# Patient Record
Sex: Female | Born: 1950 | Race: White | Hispanic: Yes | Marital: Married | Smoking: Never smoker
Health system: Southern US, Community
[De-identification: ages and names within clinical notes are randomized; demographics above are authoritative.]

## PROBLEM LIST (undated history)

## (undated) ENCOUNTER — Emergency Department (HOSPITAL_COMMUNITY): Admission: EM | Payer: Self-pay | Source: Home / Self Care

## (undated) DIAGNOSIS — I480 Paroxysmal atrial fibrillation: Secondary | ICD-10-CM

## (undated) DIAGNOSIS — N183 Chronic kidney disease, stage 3 unspecified: Secondary | ICD-10-CM

## (undated) DIAGNOSIS — I10 Essential (primary) hypertension: Secondary | ICD-10-CM

## (undated) DIAGNOSIS — E785 Hyperlipidemia, unspecified: Secondary | ICD-10-CM

## (undated) DIAGNOSIS — E119 Type 2 diabetes mellitus without complications: Secondary | ICD-10-CM

## (undated) DIAGNOSIS — F419 Anxiety disorder, unspecified: Secondary | ICD-10-CM

## (undated) DIAGNOSIS — E559 Vitamin D deficiency, unspecified: Secondary | ICD-10-CM

## (undated) HISTORY — DX: Paroxysmal atrial fibrillation: I48.0

## (undated) HISTORY — DX: Chronic kidney disease, stage 3 unspecified: N18.30

## (undated) HISTORY — DX: Hyperlipidemia, unspecified: E78.5

## (undated) HISTORY — DX: Vitamin D deficiency, unspecified: E55.9

## (undated) HISTORY — DX: Chronic kidney disease, stage 3 (moderate): N18.3

## (undated) HISTORY — DX: Anxiety disorder, unspecified: F41.9

## (undated) HISTORY — DX: Essential (primary) hypertension: I10

## (undated) HISTORY — DX: Type 2 diabetes mellitus without complications: E11.9

---

## 2009-05-26 ENCOUNTER — Ambulatory Visit: Payer: Self-pay | Admitting: Cardiology

## 2013-06-05 ENCOUNTER — Encounter: Payer: Self-pay | Admitting: Family Medicine

## 2013-06-05 ENCOUNTER — Ambulatory Visit (INDEPENDENT_AMBULATORY_CARE_PROVIDER_SITE_OTHER): Payer: Self-pay | Admitting: Family Medicine

## 2013-06-05 ENCOUNTER — Telehealth: Payer: Self-pay | Admitting: Nurse Practitioner

## 2013-06-05 ENCOUNTER — Ambulatory Visit (INDEPENDENT_AMBULATORY_CARE_PROVIDER_SITE_OTHER): Payer: BC Managed Care – PPO

## 2013-06-05 VITALS — BP 151/69 | HR 75 | Temp 97.0°F | Wt 252.8 lb

## 2013-06-05 DIAGNOSIS — IMO0001 Reserved for inherently not codable concepts without codable children: Secondary | ICD-10-CM | POA: Insufficient documentation

## 2013-06-05 DIAGNOSIS — M25561 Pain in right knee: Secondary | ICD-10-CM

## 2013-06-05 DIAGNOSIS — M25569 Pain in unspecified knee: Secondary | ICD-10-CM

## 2013-06-05 DIAGNOSIS — E669 Obesity, unspecified: Secondary | ICD-10-CM | POA: Insufficient documentation

## 2013-06-05 DIAGNOSIS — R030 Elevated blood-pressure reading, without diagnosis of hypertension: Secondary | ICD-10-CM

## 2013-06-05 MED ORDER — DICLOFENAC SODIUM 1 % TD GEL: 2.0000 g | Freq: Four times a day (QID) | TRANSDERMAL | Status: DC

## 2013-06-05 NOTE — Progress Notes (Signed)
Patient ID: Denise Rowe, female   DOB: 05/20/1951, 62 y.o.   MRN: [id number] SUBJECTIVE: CC: Chief Complaint  Patient presents with  . Acute Visit    pain rt leg x 1 month would like K+checked     HPI: 1 month ago had a leg cramp at night. 1 week later started to have pain at the beck of the right knee. Pain goes down the calf and up the back of the thigh. 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 No neurologic deficits. Limping and cannot fully bend the knee and stepping up the  Steps. No direct history.  No past medical history on file. No past surgical history on file. History   Social History  . Marital Status: Married    Spouse Name: N/A    Number of Children: N/A  . Years of Education: N/A   Occupational History  . Not on file.   Social History Main Topics  . Smoking status: Former Smoker    Quit date: 06/05/1978  . Smokeless tobacco: Not on file  . Alcohol Use: Not on file  . Drug Use: Not on file  . Sexually Active: Not on file   Other Topics Concern  . Not on file   Social History Narrative  . No narrative on file   No family history on file. No current outpatient prescriptions on file prior to visit.   No current facility-administered medications on file prior to visit.   No Known Allergies  There is no immunization history on file for this patient. Prior to Admission medications   Medication Sig Start Date End Date Taking? Authorizing Provider  diclofenac sodium (VOLTAREN) 1 % GEL Apply 2 g topically 4 (four) times daily. 06/05/13   Vernie Shanks, MD     ROS: As above in the HPI. All other systems are stable or negative.  OBJECTIVE: APPEARANCE:  Patient in no acute distress.The patient appeared well nourished and normally developed. Acyanotic. Waist: VITAL SIGNS:BP 151/69  Pulse 75  Temp(Src) 97 F (36.1 C) (Oral)  Wt 252 lb 12.8 oz (114.669 kg) Hispanic F Obese  SKIN: warm and  Dry without overt rashes,  tattoos and scars  HEAD and Neck: without JVD, Head and scalp: normal Eyes:No scleral icterus. Fundi normal, eye movements normal. Ears: Auricle normal, canal normal, Tympanic membranes normal, insufflation normal. Nose: normal Throat: normal Neck & thyroid: normal  CHEST & LUNGS: Chest wall: normal Lungs: Clear  CVS: Reveals the PMI to be normally located. Regular rhythm, First and Second Heart sounds are normal,  absence of murmurs, rubs or gallops. Peripheral vasculature: Radial pulses: normal Dorsal pedis pulses: normal Posterior pulses: normal  ABDOMEN:  Appearance: obese Benign, no organomegaly, no masses, no Abdominal Aortic enlargement. No Guarding , no rebound. No Bruits. Bowel sounds: normal  RECTAL: N/A GU: N/A  EXTREMETIES: nonedematous. Both Femoral and Pedal pulses are normal.  MUSCULOSKELETAL:  Spine: normal Joints: right knee: reduced ROM secondary to pain.right posterior knee yender. Flexion reduced to 75 degrees. Medial and lateral compartment pain. Grind test ++ ACL and PCL intact.  NEUROLOGIC: oriented to time,place and person; nonfocal. Strength is normal Sensory is normal Reflexes are normal Cranial Nerves are normal.  ASSESSMENT: Knee pain, acute, right - Plan: MR Knee Left  Wo Contrast, DG Knee 1-2 Views Right  Obesity, unspecified  Elevated BP  PLAN: [facility] reading (PRIMARY) by  Dr. Jacelyn Grip : No acute findings.  Orders Placed This Encounter  Procedures  . MR Knee Left  Wo Contrast    Standing Status: Future     Number of Occurrences:      Standing Expiration Date: 08/06/2014    Order Specific Question:  Reason for Exam (SYMPTOM  OR DIAGNOSIS REQUIRED)    Answer:  right knee pain, inability to bear weight Grind test positive for meniscus tear    Order Specific Question:  Is the patient pregnant?    Answer:  No    Order Specific Question:  Preferred imaging location?    Answer:  [facility]     Order Specific Question:  Does the patient have a pacemaker, internal devices, implants, aneury    Answer:  No  . DG Knee 1-2 Views Right    Standing Status: Future     Number of Occurrences: 1     Standing Expiration Date: 08/05/2014    Order Specific Question:  Reason for Exam (SYMPTOM  OR DIAGNOSIS REQUIRED)    Answer:  pain    Order Specific Question:  Is the patient pregnant?    Answer:  No    Order Specific Question:  Preferred imaging location?    Answer:  [facility]   Meds ordered this encounter  Medications  . diclofenac sodium (VOLTAREN) 1 % GEL    Sig: Apply 2 g topically 4 (four) times daily.    Dispense:  100 g    Refill:  1   Return if symptoms worsen or fail to improve, for await the MRI result.  Sherwood Castilla P. Jacelyn Grip, M.D.

## 2013-06-05 NOTE — Telephone Encounter (Signed)
appt given  

## 2013-06-08 ENCOUNTER — Other Ambulatory Visit (HOSPITAL_COMMUNITY): Payer: Self-pay

## 2013-06-08 ENCOUNTER — Other Ambulatory Visit: Payer: Self-pay | Admitting: Family Medicine

## 2013-06-08 ENCOUNTER — Ambulatory Visit (HOSPITAL_COMMUNITY)
Admission: RE | Admit: 2013-06-08 | Discharge: 2013-06-08 | Disposition: A | Discharge status: Home / Self Care | Payer: BC Managed Care – PPO | Source: Ambulatory Visit | Attending: Family Medicine | Admitting: Family Medicine

## 2013-06-08 DIAGNOSIS — M23329 Other meniscus derangements, posterior horn of medial meniscus, unspecified knee: Secondary | ICD-10-CM | POA: Insufficient documentation

## 2013-06-08 DIAGNOSIS — M25569 Pain in unspecified knee: Secondary | ICD-10-CM | POA: Insufficient documentation

## 2013-06-08 DIAGNOSIS — M25561 Pain in right knee: Secondary | ICD-10-CM

## 2013-06-08 DIAGNOSIS — M7120 Synovial cyst of popliteal space [Baker], unspecified knee: Secondary | ICD-10-CM | POA: Insufficient documentation

## 2013-06-08 DIAGNOSIS — M25469 Effusion, unspecified knee: Secondary | ICD-10-CM | POA: Insufficient documentation

## 2013-06-08 NOTE — Addendum Note (Signed)
Addended by: Vernie Shanks on: 06/08/2013 02:56 PM   Modules accepted: Orders

## 2013-06-08 NOTE — Progress Notes (Signed)
Patient ID: Denise Rowe, female   DOB: 01/02/1951, 62 y.o.   MRN: [id number] Need to change MRI of the Right knee.  Denise Rowe P. Jacelyn Grip, M.D.

## 2013-06-09 ENCOUNTER — Other Ambulatory Visit: Payer: Self-pay | Admitting: Family Medicine

## 2013-06-09 ENCOUNTER — Telehealth: Payer: Self-pay | Admitting: Family Medicine

## 2013-06-09 DIAGNOSIS — S83206S Unspecified tear of unspecified meniscus, current injury, right knee, sequela: Secondary | ICD-10-CM

## 2013-06-09 DIAGNOSIS — M25561 Pain in right knee: Secondary | ICD-10-CM

## 2013-06-09 NOTE — Progress Notes (Signed)
Quick Note:  Labs MRI. Partial tear of her meniscus cartilage. Needs to see orthopedics. As I suspected. ______

## 2013-06-09 NOTE — Telephone Encounter (Signed)
Left message on pt vm results and to call Tuesday to see where debbie is with the referral

## 2014-03-12 DIAGNOSIS — E785 Hyperlipidemia, unspecified: Secondary | ICD-10-CM | POA: Insufficient documentation

## 2014-03-12 DIAGNOSIS — Z0000 Encounter for general adult medical examination without abnormal findings: Secondary | ICD-10-CM | POA: Insufficient documentation

## 2014-03-12 DIAGNOSIS — E669 Obesity, unspecified: Secondary | ICD-10-CM | POA: Insufficient documentation

## 2014-06-11 DIAGNOSIS — N390 Urinary tract infection, site not specified: Secondary | ICD-10-CM | POA: Insufficient documentation

## 2015-01-31 DIAGNOSIS — R7989 Other specified abnormal findings of blood chemistry: Secondary | ICD-10-CM | POA: Insufficient documentation

## 2015-01-31 DIAGNOSIS — E559 Vitamin D deficiency, unspecified: Secondary | ICD-10-CM | POA: Insufficient documentation

## 2015-01-31 DIAGNOSIS — I10 Essential (primary) hypertension: Secondary | ICD-10-CM | POA: Insufficient documentation

## 2015-02-21 ENCOUNTER — Other Ambulatory Visit: Payer: Self-pay | Admitting: Nephrology

## 2015-02-21 DIAGNOSIS — I159 Secondary hypertension, unspecified: Secondary | ICD-10-CM

## 2015-02-21 DIAGNOSIS — E118 Type 2 diabetes mellitus with unspecified complications: Secondary | ICD-10-CM

## 2015-02-21 DIAGNOSIS — N179 Acute kidney failure, unspecified: Secondary | ICD-10-CM

## 2015-02-28 ENCOUNTER — Ambulatory Visit
Admission: RE | Admit: 2015-02-28 | Discharge: 2015-02-28 | Disposition: A | Discharge status: Home / Self Care | Payer: Self-pay | Source: Ambulatory Visit | Attending: Nephrology | Admitting: Nephrology

## 2015-02-28 DIAGNOSIS — N179 Acute kidney failure, unspecified: Secondary | ICD-10-CM

## 2015-02-28 DIAGNOSIS — I159 Secondary hypertension, unspecified: Secondary | ICD-10-CM

## 2015-02-28 DIAGNOSIS — E118 Type 2 diabetes mellitus with unspecified complications: Secondary | ICD-10-CM

## 2015-11-06 DIAGNOSIS — I480 Paroxysmal atrial fibrillation: Secondary | ICD-10-CM | POA: Insufficient documentation

## 2015-12-17 DIAGNOSIS — G4730 Sleep apnea, unspecified: Secondary | ICD-10-CM | POA: Insufficient documentation

## 2016-07-28 DIAGNOSIS — E785 Hyperlipidemia, unspecified: Secondary | ICD-10-CM | POA: Insufficient documentation

## 2016-07-28 DIAGNOSIS — I482 Chronic atrial fibrillation, unspecified: Secondary | ICD-10-CM | POA: Insufficient documentation

## 2016-07-31 ENCOUNTER — Ambulatory Visit: Payer: BLUE CROSS/BLUE SHIELD | Admitting: Internal Medicine

## 2016-08-28 ENCOUNTER — Encounter: Payer: Self-pay | Admitting: Cardiology

## 2016-08-28 NOTE — Progress Notes (Signed)
Cardiology Office Note  Date: 08/31/2016   ID: Denitra Donaghey [place], [place] 19-Aug-1951, MRN [id number]  PCP: Sherrie Mustache, MD  Consulting Cardiologist: Rozann Lesches, MD   Chief Complaint  Patient presents with  . Atrial Fibrillation    History of Present Illness: Denise Rowe is a 65 y.o. female referred for cardiology consultation by Dr. Edrick Oh. I reviewed her records and updated the chart, she is a former patient of Dr. Hamilton Capri with the [facility] cardiology practice, last seen in April of this year. She has a history of paroxysmal atrial fibrillation, had a failed cardioversion attempt earlier this year at [facility], although ultimately spontaneously converted to sinus rhythm. She states that she has had no significant palpitations since that time.  She has undergone previous cardiac testing including echocardiogram back in December 2016, I was not able to access the report. We are requesting the results from [facility].  CHADSVASC score is 3. She is on renally dosed Xarelto. Reports no bleeding problems. She also continues on metoprolol.  She works for [employer]. Originally from [place].  I reviewed her ECG today which shows sinus bradycardia with rightward axis.  Past Medical History:  Diagnosis Date  . Anxiety   . CKD (chronic kidney disease) stage 3, GFR 30-59 ml/min   . Essential hypertension   . Hyperlipidemia   . PAF (paroxysmal atrial fibrillation) ([place])   . Type 2 diabetes mellitus ([place])   . Vitamin D deficiency     Past Surgical History:  Procedure Laterality Date  . CESAREAN SECTION      Current Outpatient Prescriptions  Medication Sig Dispense Refill  . cholecalciferol (VITAMIN D) 1000 units tablet Take 1,000 Units by mouth daily.    . metoprolol tartrate (LOPRESSOR) 25 MG tablet Take 25 mg by mouth 2 (two) times daily.    . Rivaroxaban (XARELTO) 15 MG TABS tablet Take 15 mg by mouth daily.     No current facility-administered  medications for this visit.    Allergies:  Review of patient's allergies indicates no known allergies.   Social History: The patient  reports that she has never smoked. She has never used smokeless tobacco. She reports that she does not drink alcohol or use drugs.   Family History: The patient's family history includes Cancer in her mother; Diabetes Mellitus II in her father and mother; Heart disease in her father.   ROS:  Please see the history of present illness. Otherwise, complete review of systems is positive for intermittent feelings of anxiety, also elevations in blood pressure when she checks at home periodically.  All other systems are reviewed and negative.   Physical Exam: VS:  BP 140/78   Pulse 81   Ht 5\' 7"  (1.702 m)   Wt 231 lb (104.8 kg)   SpO2 99%   BMI 36.18 kg/m , BMI Body mass index is 36.18 kg/m.  Wt Readings from Last 3 Encounters:  08/31/16 231 lb (104.8 kg)  06/05/13 252 lb 12.8 oz (114.7 kg)    General: Overweight woman, appears comfortable at rest. HEENT: Conjunctiva and lids normal, oropharynx clear. Neck: Supple, no elevated JVP or carotid bruits, no thyromegaly. Lungs: Clear to auscultation, nonlabored breathing at rest. Cardiac: Regular rate and rhythm, no S3 or significant systolic murmur, no pericardial rub. Abdomen: Soft, nontender, bowel sounds present. Extremities: No pitting edema, distal pulses 2+. Skin: Warm and dry. Musculoskeletal: No kyphosis. Neuropsychiatric: Alert and oriented x3, affect grossly appropriate.  ECG: There is no old tracing for  comparison.  Recent Labwork:  March 2016: Hemoglobin 14.3, platelets 265, BUN 64, creatinine 3.1, potassium 4.6, cholesterol 257, triglycerides 280, HDL 48, LDL 153 September 2017: BUN 19, creatinine 1.6, potassium 4.6, AST 23, ALT 24, hemoglobin 13.8, platelet 197, hemoglobin A1c 5.7, cholesterol 217, triglycerides 180, HDL 51, LDL 130, TSH 3.56  Assessment and Plan:  1. Paroxysmal atrial  fibrillation, CHADSVASC score of 3. She is currently maintaining sinus rhythm and is symptomatically stable on Lopressor and renally dosed Xarelto. No changes were made today. We are requesting her echocardiogram report from [facility].  2. Essential hypertension. Patient taken off ACE inhibitor previously due to acute on chronic renal failure. She follows with Dr. Joelyn Oms in [facility] for nephrology assessment, most recent creatinine was 1.6. If blood pressure trend remains up, could consider another agent such as Norvasc.  3. Type 2 diabetes mellitus, followed by Dr. Edrick Oh. Hemoglobin A1c 5.7 in September.  4. Hyperlipidemia by history, LDL 130 in September.  Current medicines were reviewed with the patient today.   Orders Placed This Encounter  Procedures  . EKG 12-Lead    Disposition: Follow-up with me in 6 months.  Signed, Satira Sark, MD, [facility] 08/31/2016 8:46 AM    [facility] [place]. [address] [place], [place] [address] Phone: [phone]; Fax: [phone]

## 2016-08-31 ENCOUNTER — Encounter: Payer: Self-pay | Admitting: Cardiology

## 2016-08-31 ENCOUNTER — Ambulatory Visit (INDEPENDENT_AMBULATORY_CARE_PROVIDER_SITE_OTHER): Payer: BLUE CROSS/BLUE SHIELD | Admitting: Cardiology

## 2016-08-31 VITALS — BP 140/78 | HR 81 | Ht 67.0 in | Wt 231.0 lb

## 2016-08-31 DIAGNOSIS — I10 Essential (primary) hypertension: Secondary | ICD-10-CM | POA: Diagnosis not present

## 2016-08-31 DIAGNOSIS — I480 Paroxysmal atrial fibrillation: Secondary | ICD-10-CM | POA: Diagnosis not present

## 2016-08-31 DIAGNOSIS — E119 Type 2 diabetes mellitus without complications: Secondary | ICD-10-CM | POA: Diagnosis not present

## 2016-08-31 DIAGNOSIS — E782 Mixed hyperlipidemia: Secondary | ICD-10-CM | POA: Diagnosis not present

## 2016-08-31 NOTE — Patient Instructions (Signed)
Your physician wants you to follow-up in: 6 months with Dr.McDowell You will receive a reminder letter in the mail two months in advance. If you don't receive a letter, please call our office to schedule the follow-up appointment.     Your physician recommends that you continue on your current medications as directed. Please refer to the Current Medication list given to you today.     Thank you for choosing [facility] !        

## 2016-11-19 DIAGNOSIS — F411 Generalized anxiety disorder: Secondary | ICD-10-CM | POA: Insufficient documentation

## 2016-11-19 DIAGNOSIS — I10 Essential (primary) hypertension: Secondary | ICD-10-CM | POA: Diagnosis not present

## 2016-11-19 DIAGNOSIS — J0110 Acute frontal sinusitis, unspecified: Secondary | ICD-10-CM | POA: Diagnosis not present

## 2016-11-19 DIAGNOSIS — E559 Vitamin D deficiency, unspecified: Secondary | ICD-10-CM | POA: Diagnosis not present

## 2016-11-19 DIAGNOSIS — E669 Obesity, unspecified: Secondary | ICD-10-CM | POA: Diagnosis not present

## 2016-12-21 DIAGNOSIS — M79671 Pain in right foot: Secondary | ICD-10-CM | POA: Diagnosis not present

## 2017-01-12 ENCOUNTER — Other Ambulatory Visit: Payer: Self-pay | Admitting: Cardiology

## 2017-01-12 ENCOUNTER — Telehealth: Payer: Self-pay | Admitting: Cardiology

## 2017-01-12 DIAGNOSIS — R7309 Other abnormal glucose: Secondary | ICD-10-CM | POA: Diagnosis not present

## 2017-01-12 DIAGNOSIS — E559 Vitamin D deficiency, unspecified: Secondary | ICD-10-CM | POA: Diagnosis not present

## 2017-01-12 DIAGNOSIS — E784 Other hyperlipidemia: Secondary | ICD-10-CM | POA: Diagnosis not present

## 2017-01-12 MED ORDER — METOPROLOL TARTRATE 25 MG PO TABS: 25.0000 mg | ORAL_TABLET | Freq: Two times a day (BID) | ORAL | 1 refills | Status: DC

## 2017-01-12 NOTE — Telephone Encounter (Signed)
Pt would like to know why she can no longer get her metoprolol tartrate (LOPRESSOR) 25 MG tablet [[id number]]  Filled. [phone]

## 2017-01-12 NOTE — Telephone Encounter (Signed)
Attempted to reach pt at number given,got vm,[id number]

## 2017-01-12 NOTE — Telephone Encounter (Signed)
Refilled lopressor 25 bid

## 2017-01-14 DIAGNOSIS — E790 Hyperuricemia without signs of inflammatory arthritis and tophaceous disease: Secondary | ICD-10-CM | POA: Diagnosis not present

## 2017-01-14 DIAGNOSIS — R7309 Other abnormal glucose: Secondary | ICD-10-CM | POA: Diagnosis not present

## 2017-01-14 DIAGNOSIS — I10 Essential (primary) hypertension: Secondary | ICD-10-CM | POA: Diagnosis not present

## 2017-01-15 DIAGNOSIS — N261 Atrophy of kidney (terminal): Secondary | ICD-10-CM | POA: Diagnosis not present

## 2017-01-15 DIAGNOSIS — I480 Paroxysmal atrial fibrillation: Secondary | ICD-10-CM | POA: Diagnosis not present

## 2017-01-15 DIAGNOSIS — I10 Essential (primary) hypertension: Secondary | ICD-10-CM | POA: Diagnosis not present

## 2017-01-15 DIAGNOSIS — N184 Chronic kidney disease, stage 4 (severe): Secondary | ICD-10-CM | POA: Diagnosis not present

## 2017-01-18 DIAGNOSIS — N184 Chronic kidney disease, stage 4 (severe): Secondary | ICD-10-CM | POA: Diagnosis not present

## 2017-01-18 DIAGNOSIS — R8290 Unspecified abnormal findings in urine: Secondary | ICD-10-CM | POA: Diagnosis not present

## 2017-01-20 ENCOUNTER — Other Ambulatory Visit: Payer: Self-pay | Admitting: Cardiology

## 2017-01-27 DIAGNOSIS — N184 Chronic kidney disease, stage 4 (severe): Secondary | ICD-10-CM | POA: Diagnosis not present

## 2017-02-01 DIAGNOSIS — I10 Essential (primary) hypertension: Secondary | ICD-10-CM | POA: Diagnosis not present

## 2017-02-01 DIAGNOSIS — N184 Chronic kidney disease, stage 4 (severe): Secondary | ICD-10-CM | POA: Diagnosis not present

## 2017-02-01 DIAGNOSIS — M2550 Pain in unspecified joint: Secondary | ICD-10-CM | POA: Diagnosis not present

## 2017-02-02 ENCOUNTER — Other Ambulatory Visit: Payer: Self-pay | Admitting: Nephrology

## 2017-02-02 DIAGNOSIS — N184 Chronic kidney disease, stage 4 (severe): Secondary | ICD-10-CM

## 2017-02-05 NOTE — Progress Notes (Signed)
Cardiology Office Note  Date: 02/08/2017   ID: Denise Rowe [place], [place] [birth year], MRN [id number]  PCP: Sherrie Mustache, MD  Primary Cardiologist: Rozann Lesches, MD   Chief Complaint  Patient presents with  . Paroxysmal atrial fibrillation    History of Present Illness: Denise Rowe is a 66 y.o. female seen by me in consultation back in October 2017. She presents for routine follow-up. Does not report any major sense of progressive palpitations or chest pain. She has been followed more closely with her nephrologist with CKD stage 3 at baseline. Creatinine in February was 2.1.  CHADSVASC score is 3. She is on renally dosed Xarelto. No reported bleeding problems. Also tolerating metoprolol. Blood pressure is well controlled today.  Past Medical History:  Diagnosis Date  . Anxiety   . CKD (chronic kidney disease) stage 3, GFR 30-59 ml/min   . Essential hypertension   . Hyperlipidemia   . PAF (paroxysmal atrial fibrillation) ([place])   . Type 2 diabetes mellitus ([place])   . Vitamin D deficiency     Past Surgical History:  Procedure Laterality Date  . CESAREAN SECTION      Current Outpatient Prescriptions  Medication Sig Dispense Refill  . cholecalciferol (VITAMIN D) 1000 units tablet Take 1,000 Units by mouth daily.    . metoprolol tartrate (LOPRESSOR) 25 MG tablet Take 1 tablet (25 mg total) by mouth 2 (two) times daily. 180 tablet 1  . XARELTO 15 MG TABS tablet TAKE ONE TABLET (15 MG TOTAL) BY MOUTH DAILY. 30 tablet 10   No current facility-administered medications for this visit.    Allergies:  Patient has no known allergies.   Social History: The patient  reports that she has never smoked. She has never used smokeless tobacco. She reports that she does not drink alcohol or use drugs.   ROS:  Please see the history of present illness. Otherwise, complete review of systems is positive for intermittent anxiety.  All other systems are reviewed and negative.    Physical Exam: VS:  BP 122/70   Pulse 84   Ht 5\' 7"  (1.702 m)   Wt 229 lb (103.9 kg)   SpO2 95%   BMI 35.87 kg/m , BMI Body mass index is 35.87 kg/m.  Wt Readings from Last 3 Encounters:  02/08/17 229 lb (103.9 kg)  08/31/16 231 lb (104.8 kg)  06/05/13 252 lb 12.8 oz (114.7 kg)    General: Overweight woman, appears comfortable at rest. HEENT: Conjunctiva and lids normal, oropharynx clear. Neck: Supple, no elevated JVP or carotid bruits, no thyromegaly. Lungs: Clear to auscultation, nonlabored breathing at rest. Cardiac: Regular rate and rhythm, no S3 or significant systolic murmur, no pericardial rub. Abdomen: Soft, nontender, bowel sounds present. Extremities: No pitting edema, distal pulses 2+. Skin: Warm and dry. Musculoskeletal: No kyphosis. Neuropsychiatric: Alert and oriented x3, affect grossly appropriate.  ECG: I personally reviewed the tracing from 08/31/2016 which showed sinus bradycardia with rightward axis.  Recent Labwork:  September 2017: BUN 19, creatinine 1.6, potassium 4.6, AST 23, ALT 24, hemoglobin 13.8, platelet 197, hemoglobin A1c 5.7, cholesterol 217, triglycerides 180, HDL 51, LDL 130, TSH 3.56  Other Studies Reviewed Today:  Echocardiogram 11/01/2015 [facility]): LVEF 60-65%, moderate left atrial enlargement, normal right ventricular contraction, mildly sclerotic aortic valve, RVSP estimated 15 mmHg.  Assessment and Plan:  1. Paroxysmal atrial fibrillation with CHADSVASC score of 3. Tolerating current regimen well. Continue renally dosed Xarelto, also metoprolol. Follow-up arranged.  2. CKD stage 3, continues  to follow with nephrology. Most recent creatinine 2.1.  3. Essential hypertension, systolic blood pressure 074G today.  4. Type 2 diabetes mellitus, managed by diet. She states that she is more focused on diet and weight loss, is eating a vegetarian diet over the last month and losing weight. Keep follow-up with Dr. Edrick Oh. Hemoglobin A1c  6.4.  Current medicines were reviewed with the patient today.  Disposition: Follow-up in 6 months.  Signed, Satira Sark, MD, [facility] 02/08/2017 11:58 AM    [place] at [facility] [address] [place], [place] [address] Phone: [phone]; Fax: [phone]

## 2017-02-08 ENCOUNTER — Ambulatory Visit
Admission: RE | Admit: 2017-02-08 | Discharge: 2017-02-08 | Disposition: A | Discharge status: Home / Self Care | Payer: BLUE CROSS/BLUE SHIELD | Source: Ambulatory Visit | Attending: Nephrology | Admitting: Nephrology

## 2017-02-08 ENCOUNTER — Encounter: Payer: Self-pay | Admitting: Cardiology

## 2017-02-08 ENCOUNTER — Ambulatory Visit (INDEPENDENT_AMBULATORY_CARE_PROVIDER_SITE_OTHER): Payer: BLUE CROSS/BLUE SHIELD | Admitting: Cardiology

## 2017-02-08 VITALS — BP 122/70 | HR 84 | Ht 67.0 in | Wt 229.0 lb

## 2017-02-08 DIAGNOSIS — I10 Essential (primary) hypertension: Secondary | ICD-10-CM | POA: Diagnosis not present

## 2017-02-08 DIAGNOSIS — N186 End stage renal disease: Secondary | ICD-10-CM | POA: Diagnosis not present

## 2017-02-08 DIAGNOSIS — N184 Chronic kidney disease, stage 4 (severe): Secondary | ICD-10-CM

## 2017-02-08 DIAGNOSIS — I480 Paroxysmal atrial fibrillation: Secondary | ICD-10-CM

## 2017-02-08 DIAGNOSIS — E119 Type 2 diabetes mellitus without complications: Secondary | ICD-10-CM | POA: Diagnosis not present

## 2017-02-08 DIAGNOSIS — N183 Chronic kidney disease, stage 3 unspecified: Secondary | ICD-10-CM

## 2017-02-08 NOTE — Patient Instructions (Signed)
Your physician wants you to follow-up in: 6 months with Dr McDowell You will receive a reminder letter in the mail two months in advance. If you don't receive a letter, please call our office to schedule the follow-up appointment.     Your physician recommends that you continue on your current medications as directed. Please refer to the Current Medication list given to you today.    If you need a refill on your cardiac medications before your next appointment, please call your pharmacy.     Thank you for choosing  [facility] !        

## 2017-02-11 DIAGNOSIS — N184 Chronic kidney disease, stage 4 (severe): Secondary | ICD-10-CM | POA: Diagnosis not present

## 2017-02-24 DIAGNOSIS — R102 Pelvic and perineal pain: Secondary | ICD-10-CM | POA: Diagnosis not present

## 2017-02-24 DIAGNOSIS — Z01419 Encounter for gynecological examination (general) (routine) without abnormal findings: Secondary | ICD-10-CM | POA: Diagnosis not present

## 2017-02-24 DIAGNOSIS — Z1231 Encounter for screening mammogram for malignant neoplasm of breast: Secondary | ICD-10-CM | POA: Diagnosis not present

## 2017-02-24 DIAGNOSIS — Z1151 Encounter for screening for human papillomavirus (HPV): Secondary | ICD-10-CM | POA: Diagnosis not present

## 2017-02-25 ENCOUNTER — Ambulatory Visit: Payer: BLUE CROSS/BLUE SHIELD | Admitting: Cardiology

## 2017-02-25 DIAGNOSIS — N184 Chronic kidney disease, stage 4 (severe): Secondary | ICD-10-CM | POA: Diagnosis not present

## 2017-02-25 DIAGNOSIS — M109 Gout, unspecified: Secondary | ICD-10-CM | POA: Diagnosis not present

## 2017-03-01 DIAGNOSIS — N184 Chronic kidney disease, stage 4 (severe): Secondary | ICD-10-CM | POA: Diagnosis not present

## 2017-03-01 DIAGNOSIS — I10 Essential (primary) hypertension: Secondary | ICD-10-CM | POA: Diagnosis not present

## 2017-04-19 DIAGNOSIS — R7309 Other abnormal glucose: Secondary | ICD-10-CM | POA: Diagnosis not present

## 2017-04-19 DIAGNOSIS — Z1211 Encounter for screening for malignant neoplasm of colon: Secondary | ICD-10-CM | POA: Diagnosis not present

## 2017-04-19 DIAGNOSIS — E785 Hyperlipidemia, unspecified: Secondary | ICD-10-CM | POA: Diagnosis not present

## 2017-04-19 DIAGNOSIS — I10 Essential (primary) hypertension: Secondary | ICD-10-CM | POA: Diagnosis not present

## 2017-04-19 DIAGNOSIS — Z23 Encounter for immunization: Secondary | ICD-10-CM | POA: Diagnosis not present

## 2017-04-21 DIAGNOSIS — E790 Hyperuricemia without signs of inflammatory arthritis and tophaceous disease: Secondary | ICD-10-CM | POA: Diagnosis not present

## 2017-04-21 DIAGNOSIS — E559 Vitamin D deficiency, unspecified: Secondary | ICD-10-CM | POA: Diagnosis not present

## 2017-04-21 DIAGNOSIS — I10 Essential (primary) hypertension: Secondary | ICD-10-CM | POA: Diagnosis not present

## 2017-04-21 DIAGNOSIS — E785 Hyperlipidemia, unspecified: Secondary | ICD-10-CM | POA: Diagnosis not present

## 2017-07-09 ENCOUNTER — Other Ambulatory Visit: Payer: Self-pay | Admitting: Cardiology

## 2017-08-05 DIAGNOSIS — R300 Dysuria: Secondary | ICD-10-CM | POA: Diagnosis not present

## 2017-08-05 DIAGNOSIS — I10 Essential (primary) hypertension: Secondary | ICD-10-CM | POA: Diagnosis not present

## 2017-08-05 DIAGNOSIS — N390 Urinary tract infection, site not specified: Secondary | ICD-10-CM | POA: Diagnosis not present

## 2017-08-05 DIAGNOSIS — R319 Hematuria, unspecified: Secondary | ICD-10-CM | POA: Diagnosis not present

## 2017-08-17 DIAGNOSIS — M549 Dorsalgia, unspecified: Secondary | ICD-10-CM | POA: Diagnosis not present

## 2017-08-17 DIAGNOSIS — S29012A Strain of muscle and tendon of back wall of thorax, initial encounter: Secondary | ICD-10-CM | POA: Insufficient documentation

## 2017-08-17 DIAGNOSIS — I10 Essential (primary) hypertension: Secondary | ICD-10-CM | POA: Diagnosis not present

## 2017-08-17 DIAGNOSIS — S29012D Strain of muscle and tendon of back wall of thorax, subsequent encounter: Secondary | ICD-10-CM | POA: Diagnosis not present

## 2017-09-23 NOTE — Progress Notes (Signed)
Cardiology Office Note  Date: 09/24/2017   ID: Leidi, [place] 12/21/1950, MRN [id number]  PCP: Dione Housekeeper, MD  Primary Cardiologist: Rozann Lesches, MD   Chief Complaint  Patient presents with  . Atrial Fibrillation    History of Present Illness: Denise Rowe is a 66 y.o. female last seen in March.  She presents for a routine follow-up visit.  Since last encounter she does not report any significant chest pain or palpitations.  She states that she has been taking her medications regularly.  I reviewed her medications which are outlined below and unchanged.  She remains on an appropriate dose of Xarelto in light of renal insufficiency.  She has had lab work per Dr. Edrick Oh.  She does not report any bleeding problems.  I personally reviewed her ECG today which shows rate controlled atrial fibrillation with rightward axis.  Past Medical History:  Diagnosis Date  . Anxiety   . CKD (chronic kidney disease) stage 3, GFR 30-59 ml/min (H[facility])   . Essential hypertension   . Hyperlipidemia   . PAF (paroxysmal atrial fibrillation) ([place])   . Type 2 diabetes mellitus ([place])   . Vitamin D deficiency     Past Surgical History:  Procedure Laterality Date  . CESAREAN SECTION      Current Outpatient Medications  Medication Sig Dispense Refill  . cholecalciferol (VITAMIN D) 1000 units tablet Take 1,000 Units by mouth daily.    . metoprolol tartrate (LOPRESSOR) 25 MG tablet TAKE 1 TABLET (25 MG TOTAL) BY MOUTH 2 (TWO) TIMES DAILY. 180 tablet 1  . XARELTO 15 MG TABS tablet TAKE ONE TABLET (15 MG TOTAL) BY MOUTH DAILY. 30 tablet 10   No current facility-administered medications for this visit.    Allergies:  Patient has no known allergies.   Social History: The patient  reports that  has never smoked. she has never used smokeless tobacco. She reports that she does not drink alcohol or use drugs.   ROS:  Please see the history of present illness. Otherwise, complete  review of systems is positive for none.  All other systems are reviewed and negative.   Physical Exam: VS:  BP 126/80 (BP Location: Left Arm)   Pulse 94   Ht 5\' 7"  (1.702 m)   Wt 229 lb (103.9 kg)   SpO2 97%   BMI 35.87 kg/m , BMI Body mass index is 35.87 kg/m.  Wt Readings from Last 3 Encounters:  09/24/17 229 lb (103.9 kg)  02/08/17 229 lb (103.9 kg)  08/31/16 231 lb (104.8 kg)    General: Overweight woman, appears comfortable at rest. HEENT: Conjunctiva and lids normal, oropharynx clear. Neck: Supple, no elevated JVP or carotid bruits, no thyromegaly. Lungs: Clear to auscultation, nonlabored breathing at rest. Cardiac: Irregularly irregular, no S3 or significant systolic murmur, no pericardial rub. Abdomen: Soft, nontender, bowel sounds present. Extremities: No pitting edema, distal pulses 2+.  ECG: I personally reviewed the tracing from 08/31/2016 which showed sinus bradycardia with rightward axis.  Recent Labwork:  March 2018: Hemoglobin A1c 6.20 December 2016: BUN 36, creatinine 2.15, potassium 4.2, AST 12, ALT 18, cholesterol 157, triglycerides 123, HDL 43, LDL 89 September 2017: Hemoglobin 13.8, platelets 197  Other Studies Reviewed Today:  Echocardiogram 11/01/2015 [facility]): LVEF 60-65%, moderate left atrial enlargement, normal right ventricular contraction, mildly sclerotic aortic valve, RVSP estimated 15 mmHg.  Assessment and Plan:  1.  Paroxysmal to persistent atrial fibrillation with CHADSVASC score of 3.  She is doing  well without palpitations on metoprolol and tolerating renally dosed Xarelto without bleeding problems.  No changes were made today.  2.  CKD stage III, continues to follow with nephrology.  Last creatinine was 2.15.  3.  Essential hypertension, no changes made to present regimen.  Keep follow-up with Dr. Edrick Oh.  Current medicines were reviewed with the patient today.   Orders Placed This Encounter  Procedures  . EKG 12-Lead     Disposition: Follow-up in 6 months.  Signed, Satira Sark, MD, [facility] 09/24/2017 2:21 PM    [place] at [facility] [address], [place], [place] [address] Phone: [phone]; Fax: [phone]

## 2017-09-24 ENCOUNTER — Encounter: Payer: Self-pay | Admitting: Cardiology

## 2017-09-24 ENCOUNTER — Ambulatory Visit (INDEPENDENT_AMBULATORY_CARE_PROVIDER_SITE_OTHER): Payer: BLUE CROSS/BLUE SHIELD | Admitting: Cardiology

## 2017-09-24 VITALS — BP 126/80 | HR 94 | Ht 67.0 in | Wt 229.0 lb

## 2017-09-24 DIAGNOSIS — N183 Chronic kidney disease, stage 3 unspecified: Secondary | ICD-10-CM

## 2017-09-24 DIAGNOSIS — I480 Paroxysmal atrial fibrillation: Secondary | ICD-10-CM | POA: Diagnosis not present

## 2017-09-24 DIAGNOSIS — I10 Essential (primary) hypertension: Secondary | ICD-10-CM | POA: Diagnosis not present

## 2017-09-24 NOTE — Patient Instructions (Signed)
Your physician wants you to follow-up in:6 months with Dr.McDowell You will receive a reminder letter in the mail two months in advance. If you don't receive a letter, please call our office to schedule the follow-up appointment.   Your physician recommends that you continue on your current medications as directed. Please refer to the Current Medication list given to you today.   If you need a refill on your cardiac medications before your next appointment, please call your pharmacy.    No lab work or tests ordered today.      Thank you for choosing  [facility] !         

## 2017-10-08 DIAGNOSIS — N184 Chronic kidney disease, stage 4 (severe): Secondary | ICD-10-CM | POA: Diagnosis not present

## 2017-10-18 DIAGNOSIS — N184 Chronic kidney disease, stage 4 (severe): Secondary | ICD-10-CM | POA: Diagnosis not present

## 2017-10-18 DIAGNOSIS — I129 Hypertensive chronic kidney disease with stage 1 through stage 4 chronic kidney disease, or unspecified chronic kidney disease: Secondary | ICD-10-CM | POA: Diagnosis not present

## 2017-12-26 ENCOUNTER — Other Ambulatory Visit: Payer: Self-pay | Admitting: Cardiology

## 2018-01-07 ENCOUNTER — Other Ambulatory Visit: Payer: Self-pay | Admitting: Cardiology

## 2018-02-03 DIAGNOSIS — I10 Essential (primary) hypertension: Secondary | ICD-10-CM | POA: Diagnosis not present

## 2018-02-03 DIAGNOSIS — R300 Dysuria: Secondary | ICD-10-CM | POA: Diagnosis not present

## 2018-02-03 DIAGNOSIS — E669 Obesity, unspecified: Secondary | ICD-10-CM | POA: Diagnosis not present

## 2018-02-03 DIAGNOSIS — Z8739 Personal history of other diseases of the musculoskeletal system and connective tissue: Secondary | ICD-10-CM | POA: Insufficient documentation

## 2018-02-03 DIAGNOSIS — E559 Vitamin D deficiency, unspecified: Secondary | ICD-10-CM | POA: Diagnosis not present

## 2018-03-04 ENCOUNTER — Other Ambulatory Visit: Payer: Self-pay

## 2018-03-04 ENCOUNTER — Emergency Department (HOSPITAL_COMMUNITY): Payer: BLUE CROSS/BLUE SHIELD

## 2018-03-04 ENCOUNTER — Encounter (HOSPITAL_COMMUNITY): Payer: Self-pay | Admitting: Emergency Medicine

## 2018-03-04 ENCOUNTER — Emergency Department (HOSPITAL_COMMUNITY)
Admission: EM | Admit: 2018-03-04 | Discharge: 2018-03-05 | Disposition: A | Discharge status: Home / Self Care | Payer: BLUE CROSS/BLUE SHIELD | Attending: Emergency Medicine | Admitting: Emergency Medicine

## 2018-03-04 DIAGNOSIS — I129 Hypertensive chronic kidney disease with stage 1 through stage 4 chronic kidney disease, or unspecified chronic kidney disease: Secondary | ICD-10-CM | POA: Insufficient documentation

## 2018-03-04 DIAGNOSIS — I10 Essential (primary) hypertension: Secondary | ICD-10-CM | POA: Diagnosis not present

## 2018-03-04 DIAGNOSIS — Z79899 Other long term (current) drug therapy: Secondary | ICD-10-CM | POA: Diagnosis not present

## 2018-03-04 DIAGNOSIS — N183 Chronic kidney disease, stage 3 (moderate): Secondary | ICD-10-CM | POA: Diagnosis not present

## 2018-03-04 DIAGNOSIS — R079 Chest pain, unspecified: Secondary | ICD-10-CM | POA: Insufficient documentation

## 2018-03-04 DIAGNOSIS — R51 Headache: Secondary | ICD-10-CM | POA: Diagnosis not present

## 2018-03-04 DIAGNOSIS — R0602 Shortness of breath: Secondary | ICD-10-CM | POA: Diagnosis not present

## 2018-03-04 NOTE — ED Triage Notes (Signed)
Patient has mid-sternal chest pain, starting two days ago.

## 2018-03-04 NOTE — ED Provider Notes (Signed)
[facility] EMERGENCY DEPARTMENT Provider Note   CSN: [id number] Arrival date & time: 03/04/18  2247     History   Chief Complaint Chief Complaint  Patient presents with  . Chest Pain    HPI Denise Rowe is a 67 y.o. female.  Patient presents to ER for evaluation of chest pain.  Symptoms began yesterday.  She reports a continuous heaviness over the center of her chest.  At times it causes her to feel short of breath.  She has not identified anything that alleviates or worsens the pain.  No associated diaphoresis, nausea, vomiting.  Patient does not have any known coronary artery disease.  She is treated for chronic atrial fibrillation.  Patient reports that today she has noticed her blood pressure has been very elevated.  She took an additional Norvasc but it remained high.  She has a slight headache.  No vision change.     Past Medical History:  Diagnosis Date  . Anxiety   . CKD (chronic kidney disease) stage 3, GFR 30-59 ml/min (HCC)   . Essential hypertension   . Hyperlipidemia   . PAF (paroxysmal atrial fibrillation) ([place])   . Type 2 diabetes mellitus ([place])   . Vitamin D deficiency     Patient Active Problem List   Diagnosis Date Noted  . Knee pain, acute 06/05/2013  . Elevated BP 06/05/2013  . Obesity, unspecified 06/05/2013    Past Surgical History:  Procedure Laterality Date  . CESAREAN SECTION       OB History   None      Home Medications    Prior to Admission medications   Medication Sig Start Date End Date Taking? Authorizing Provider  amLODipine (NORVASC) 5 MG tablet Take 5 mg by mouth daily.   Yes [provider]  cholecalciferol (VITAMIN D) 1000 units tablet Take 1,000 Units by mouth daily.   Yes [provider]  metoprolol tartrate (LOPRESSOR) 25 MG tablet TAKE 1 TABLET (25 MG TOTAL) BY MOUTH 2 (TWO) TIMES DAILY. 01/07/18  Yes Satira Sark, MD  XARELTO 15 MG TABS tablet TAKE ONE TABLET (15 MG TOTAL) BY MOUTH  DAILY. 12/27/17  Yes Satira Sark, MD    Family History Family History  Problem Relation Age of Onset  . Cancer Mother   . Diabetes Mellitus II Mother   . Heart disease Father   . Diabetes Mellitus II Father     Social History Social History   Tobacco Use  . Smoking status: Never Smoker  . Smokeless tobacco: Never Used  Substance Use Topics  . Alcohol use: No  . Drug use: No     Allergies   Patient has no known allergies.   Review of Systems Review of Systems  Respiratory: Positive for shortness of breath.   Cardiovascular: Positive for chest pain.  Neurological: Positive for headaches.  All other systems reviewed and are negative.    Physical Exam Updated Vital Signs BP 111/70   Pulse 81   Temp 98.1 F (36.7 C) (Oral)   Resp 20   Ht 5\' 7"  (1.702 m)   Wt 108.4 kg (239 lb)   SpO2 98%   BMI 37.43 kg/m   Physical Exam  Constitutional: She is oriented to person, place, and time. She appears well-developed and well-nourished. No distress.  HENT:  Head: Normocephalic and atraumatic.  Right Ear: Hearing normal.  Left Ear: Hearing normal.  Nose: Nose normal.  Mouth/Throat: Oropharynx is clear and moist and mucous  membranes are normal.  Eyes: Pupils are equal, round, and reactive to light. Conjunctivae and EOM are normal.  Neck: Normal range of motion. Neck supple.  Cardiovascular: Regular rhythm, S1 normal and S2 normal. Exam reveals no gallop and no friction rub.  No murmur heard. Pulmonary/Chest: Effort normal and breath sounds normal. No respiratory distress. She exhibits no tenderness.  Abdominal: Soft. Normal appearance and bowel sounds are normal. There is no hepatosplenomegaly. There is no tenderness. There is no rebound, no guarding, no tenderness at McBurney's point and negative Murphy's sign. No hernia.  Musculoskeletal: Normal range of motion.  Neurological: She is alert and oriented to person, place, and time. She has normal strength. No  cranial nerve deficit or sensory deficit. Coordination normal. GCS eye subscore is 4. GCS verbal subscore is 5. GCS motor subscore is 6.  Skin: Skin is warm, dry and intact. No rash noted. No cyanosis.  Psychiatric: Her speech is normal and behavior is normal. Thought content normal. Her mood appears anxious.  Nursing note and vitals reviewed.    ED Treatments / Results  Labs (all labs ordered are listed, but only abnormal results are displayed) Labs Reviewed  BASIC METABOLIC PANEL - Abnormal; Notable for the following components:      Result Value   Glucose, Bld 156 (*)    BUN 38 (*)    Creatinine, Ser 1.79 (*)    GFR calc non Af Amer 29 (*)    GFR calc Af Amer 33 (*)    All other components within normal limits  CBC  I-STAT TROPONIN, ED    EKG EKG Interpretation  Date/Time:  Friday March 04 2018 22:51:46 EDT Ventricular Rate:  100 PR Interval:    QRS Duration: 84 QT Interval:  360 QTC Calculation: 464 R Axis:   133 Text Interpretation:  Atrial fibrillation Right axis deviation Low voltage QRS Abnormal ECG np Confirmed by Orpah Greek [phone]) on 03/05/2018 12:43:01 AM   Radiology Dg Chest 2 View  Result Date: 03/04/2018 CLINICAL DATA:  Midsternal chest pain EXAM: CHEST - 2 VIEW COMPARISON:  None. FINDINGS: Normal mediastinum and cardiac silhouette. Normal pulmonary vasculature. No evidence of effusion, infiltrate, or pneumothorax. No acute bony abnormality. Prominent epicardial fat pad. IMPRESSION: No acute cardiopulmonary process. Electronically Signed   By: Suzy Bouchard M.D.   On: 03/04/2018 23:21    Procedures Procedures (including critical care time)  Medications Ordered in ED Medications - No data to display   Initial Impression / Assessment and Plan / ED Course  I have reviewed the triage vital signs and the nursing notes.  Pertinent labs & imaging results that were available during my care of the patient were reviewed by me and considered in my  medical decision making (see chart for details).     Patient presents to the emergency department for evaluation of chest pain.  She has been having a heaviness in her chest for more than 24 hours.  It does not appear to be related to exertion.  She has not identified any cause or alleviating factors.  Patient reports that she is under a great deal of stress at work and is very anxious.  This may be part of the etiology.  Her EKG reveals chronic atrial fibrillation without ischemic changes.  She is on Xarelto.  She is not experiencing tachycardia, tachypnea, shortness of breath, hypoxia.  I doubt PE.  She has chronic renal insufficiency, creatinine is at baseline.  No evidence of congestive heart failure.  Her workup has been unremarkable.  I did discuss the possibility of admission with the patient.  She would prefer to be discharged and follow-up with her cardiologist.  A second troponin therefore ordered and is negative.  Patient will call her cardiologist Monday morning for a follow-up and otherwise return to the ER for any worsening symptoms.  Her blood pressure has markedly improved here in the ER.  I believe this was secondary to her anxiety.  As her blood pressure and anxiety alleviated, her symptoms improved as well.  Final Clinical Impressions(s) / ED Diagnoses   Final diagnoses:  Nonspecific chest pain  Essential hypertension    ED Discharge Orders    None       Orpah Greek, MD 03/05/18 [phone]

## 2018-03-05 LAB — CBC
HCT: 44.1 % (ref 36.0–46.0)
Hemoglobin: 14.7 g/dL (ref 12.0–15.0)
MCH: 31.5 pg (ref 26.0–34.0)
MCHC: 33.3 g/dL (ref 30.0–36.0)
MCV: 94.6 fL (ref 78.0–100.0)
PLATELETS: 181 10*3/uL (ref 150–400)
RBC: 4.66 MIL/uL (ref 3.87–5.11)
RDW: 12.6 % (ref 11.5–15.5)
WBC: 7.7 10*3/uL (ref 4.0–10.5)

## 2018-03-05 LAB — I-STAT TROPONIN, ED
TROPONIN I, POC: 0.01 ng/mL (ref 0.00–0.08)
Troponin i, poc: 0 ng/mL (ref 0.00–0.08)

## 2018-03-05 LAB — BASIC METABOLIC PANEL
Anion gap: 12 (ref 5–15)
BUN: 38 mg/dL — AB (ref 6–20)
CALCIUM: 9.6 mg/dL (ref 8.9–10.3)
CO2: 23 mmol/L (ref 22–32)
Chloride: 108 mmol/L (ref 101–111)
Creatinine, Ser: 1.79 mg/dL — ABNORMAL HIGH (ref 0.44–1.00)
GFR calc Af Amer: 33 mL/min — ABNORMAL LOW (ref >60)
GFR, EST NON AFRICAN AMERICAN: 29 mL/min — AB (ref >60)
GLUCOSE: 156 mg/dL — AB (ref 65–99)
Potassium: 4 mmol/L (ref 3.5–5.1)
SODIUM: 143 mmol/L (ref 135–145)

## 2018-03-08 ENCOUNTER — Encounter: Payer: Self-pay | Admitting: *Deleted

## 2018-03-08 ENCOUNTER — Ambulatory Visit (INDEPENDENT_AMBULATORY_CARE_PROVIDER_SITE_OTHER): Payer: BLUE CROSS/BLUE SHIELD | Admitting: Cardiology

## 2018-03-08 ENCOUNTER — Telehealth: Payer: Self-pay | Admitting: Cardiology

## 2018-03-08 ENCOUNTER — Encounter: Payer: Self-pay | Admitting: Cardiology

## 2018-03-08 VITALS — BP 126/94 | HR 88 | Ht 67.0 in | Wt 237.0 lb

## 2018-03-08 DIAGNOSIS — N183 Chronic kidney disease, stage 3 unspecified: Secondary | ICD-10-CM

## 2018-03-08 DIAGNOSIS — I481 Persistent atrial fibrillation: Secondary | ICD-10-CM

## 2018-03-08 DIAGNOSIS — I10 Essential (primary) hypertension: Secondary | ICD-10-CM

## 2018-03-08 DIAGNOSIS — R072 Precordial pain: Secondary | ICD-10-CM

## 2018-03-08 DIAGNOSIS — I4819 Other persistent atrial fibrillation: Secondary | ICD-10-CM

## 2018-03-08 NOTE — Telephone Encounter (Signed)
LEXISCAN scheduled at [facility] Mar 18, 2018

## 2018-03-08 NOTE — Progress Notes (Signed)
Cardiology Office Note  Date: 03/08/2018   ID: Denise, Rowe 1951-05-18, MRN [id number]  PCP: Denise Housekeeper, MD  Primary Cardiologist: Denise Lesches, MD   Chief Complaint  Patient presents with  . ER follow-up  . PAF    History of Present Illness: Denise Rowe is a 67 y.o. female last seen in November 2018.  She presents to the office today after recent ER visit on April 19 reporting elevated blood pressure and chest discomfort.  ECG showed no acute ST segment changes and troponin I levels were negative.  Anxiety and stress were felt to be potential contributors based on review of the notes.  She is here today with her husband.  She states that she has had a change at her job and has been under a lot more stress.  She reports that her diastolic blood pressure has mainly been elevated at home when her husband checks it, she is also had intermittent vague chest discomfort that she attributes to anxiety.  No specific sense of palpitations.  Review her medications.  She has been taking her Lopressor only once a day.  Reports compliance with Norvasc.  She has not undergone any recent ischemic testing.  She did have a prior stress test with her previous cardiologist prior to transitioning to our group.  Past Medical History:  Diagnosis Date  . Anxiety   . CKD (chronic kidney disease) stage 3, GFR 30-59 ml/min (HCC)   . Essential hypertension   . Hyperlipidemia   . PAF (paroxysmal atrial fibrillation) ([place])   . Type 2 diabetes mellitus ([place])   . Vitamin D deficiency     Past Surgical History:  Procedure Laterality Date  . CESAREAN SECTION      Current Outpatient Medications  Medication Sig Dispense Refill  . amLODipine (NORVASC) 5 MG tablet Take 5 mg by mouth daily.    . metoprolol tartrate (LOPRESSOR) 25 MG tablet TAKE 1 TABLET (25 MG TOTAL) BY MOUTH 2 (TWO) TIMES DAILY. 180 tablet 1  . Vitamin D, Ergocalciferol, (DRISDOL) 50000 units CAPS capsule Take 1  capsule by mouth once a week.  1  . XARELTO 15 MG TABS tablet TAKE ONE TABLET (15 MG TOTAL) BY MOUTH DAILY. 30 tablet 10   No current facility-administered medications for this visit.    Allergies:  Patient has no known allergies.   Social History: The patient  reports that she has never smoked. She has never used smokeless tobacco. She reports that she does not drink alcohol or use drugs.   ROS:  Please see the history of present illness. Otherwise, complete review of systems is positive for anxiety.  All other systems are reviewed and negative.   Physical Exam: VS:  BP (!) 126/94   Pulse 88   Ht 5\' 7"  (1.702 m)   Wt 237 lb (107.5 kg)   BMI 37.12 kg/m , BMI Body mass index is 37.12 kg/m.  Wt Readings from Last 3 Encounters:  03/08/18 237 lb (107.5 kg)  03/04/18 239 lb (108.4 kg)  09/24/17 229 lb (103.9 kg)    General: Patient appears comfortable at rest. HEENT: Conjunctiva and lids normal, oropharynx clear. Neck: Supple, no elevated JVP or carotid bruits, no thyromegaly. Lungs: Clear to auscultation, nonlabored breathing at rest. Cardiac: Irregularly irregular, no S3 or significant systolic murmur, no pericardial rub. Abdomen: Soft, nontender, bowel sounds present. Extremities: No pitting edema, distal pulses 2+. Skin: Warm and dry. Musculoskeletal: No kyphosis. Neuropsychiatric: Alert and oriented  x3, affect grossly appropriate.  ECG: I personally reviewed the tracing from 03/04/2018 which showed atrial fibrillation with rightward axis.  Recent Labwork: 03/05/2018: BUN 38; Creatinine, Ser 1.79; Hemoglobin 14.7; Platelets 181; Potassium 4.0; Sodium 143   Other Studies Reviewed Today:  Echocardiogram 11/01/2015([facility]): LVEF 60-65%, moderate left atrial enlargement, normal right ventricular contraction, mildly sclerotic aortic valve, RVSP estimated 15 mmHg.  Assessment and Plan:  1.  Essential hypertension, recently not optimally controlled with elevations in  diastolic blood pressure. She was taking her Lopressor only once a day, this will be taken at 25 mg twice daily going forward.  Continue current dose of Norvasc.  Record blood pressures at home and further adjustments can be made as needed.  2.  Precordial pain in the setting of elevated diastolic blood pressures.  She does not have any known ischemic heart disease, but has not undergone any recent ischemic evaluation.  We will proceed with a [place] for further assessment.  3.  Situational stress.  Could also be contributing to above issues.  4.  CKD stage 3, recent creatinine 1.79 is stable.  Current medicines were reviewed with the patient today.   Orders Placed This Encounter  Procedures  . NM Myocar Multi W/Spect W/Wall Motion / EF    Disposition: Follow-up in the [place] office for review.  Signed, Satira Sark, MD, [facility] 03/08/2018 3:27 PM    [facility] [address] [place], [place],  [address] Phone: [phone]; Fax: [phone]

## 2018-03-08 NOTE — Patient Instructions (Signed)
Medication Instructions:  TAKE LOPRESSOR 25 MG - TWO TIMES DAILY   Labwork: NONE  Testing/Procedures: Your physician has requested that you have a lexiscan myoview. For further information please visit [email or web address]. Please follow instruction sheet, as given.     Follow-Up: Your physician recommends that you schedule a follow-up appointment in: 3-4 WEEKS    Any Other Special Instructions Will Be Listed Below (If Applicable).     If you need a refill on your cardiac medications before your next appointment, please call your pharmacy.

## 2018-03-18 ENCOUNTER — Encounter (HOSPITAL_BASED_OUTPATIENT_CLINIC_OR_DEPARTMENT_OTHER)
Admission: RE | Admit: 2018-03-18 | Discharge: 2018-03-18 | Disposition: A | Discharge status: Home / Self Care | Payer: BLUE CROSS/BLUE SHIELD | Source: Ambulatory Visit | Attending: Cardiology | Admitting: Cardiology

## 2018-03-18 ENCOUNTER — Encounter (HOSPITAL_COMMUNITY)
Admission: RE | Admit: 2018-03-18 | Discharge: 2018-03-18 | Disposition: A | Discharge status: Home / Self Care | Payer: BLUE CROSS/BLUE SHIELD | Source: Ambulatory Visit | Attending: Cardiology | Admitting: Cardiology

## 2018-03-18 ENCOUNTER — Encounter (HOSPITAL_COMMUNITY): Payer: Self-pay

## 2018-03-18 DIAGNOSIS — R079 Chest pain, unspecified: Secondary | ICD-10-CM | POA: Insufficient documentation

## 2018-03-18 LAB — NM MYOCAR MULTI W/SPECT W/WALL MOTION / EF
CHL CUP NUCLEAR SDS: 4
CHL CUP NUCLEAR SRS: 4
CHL CUP RESTING HR STRESS: 77 {beats}/min
LV dias vol: 57 mL (ref 46–106)
LVSYSVOL: 16 mL
NUC STRESS TID: 1.04
Peak HR: 116 {beats}/min
RATE: 0.65
SSS: 8

## 2018-03-18 MED ORDER — TECHNETIUM TC 99M TETROFOSMIN IV KIT
30.0000 | PACK | Freq: Once | INTRAVENOUS | Status: AC | PRN
  Administered 2018-03-18: 29 via INTRAVENOUS

## 2018-03-18 MED ORDER — REGADENOSON 0.4 MG/5ML IV SOLN
INTRAVENOUS | Status: AC
  Administered 2018-03-18: 0.4 mg via INTRAVENOUS
  Filled 2018-03-18: qty 5

## 2018-03-18 MED ORDER — SODIUM CHLORIDE 0.9% FLUSH
INTRAVENOUS | Status: AC
  Administered 2018-03-18: 10 mL via INTRAVENOUS
  Filled 2018-03-18: qty 10

## 2018-03-18 MED ORDER — TECHNETIUM TC 99M TETROFOSMIN IV KIT
10.0000 | PACK | Freq: Once | INTRAVENOUS | Status: AC | PRN
  Administered 2018-03-18: 10.3 via INTRAVENOUS

## 2018-03-22 ENCOUNTER — Telehealth: Payer: Self-pay

## 2018-03-22 NOTE — Telephone Encounter (Signed)
LMTCB

## 2018-03-22 NOTE — Telephone Encounter (Signed)
Patient returned call to Denise Rowe

## 2018-03-22 NOTE — Telephone Encounter (Signed)
Patient notified and verbalized understanding. Routed to PCP

## 2018-03-22 NOTE — Telephone Encounter (Signed)
-----   Message from Merlene Laughter, LPN sent at 02/14/9621  4:58 PM EDT -----   ----- Message ----- From: Satira Sark, MD Sent: 03/18/2018   4:00 PM To: Merlene Laughter, LPN  Results reviewed.  Stress testing was low risk with no clear evidence of infarct scar or ischemia.  Overall reassuring. A copy of this test should be forwarded to Dione Housekeeper, MD.

## 2018-04-06 ENCOUNTER — Ambulatory Visit: Payer: BLUE CROSS/BLUE SHIELD | Admitting: Cardiology

## 2018-04-08 ENCOUNTER — Ambulatory Visit: Payer: BLUE CROSS/BLUE SHIELD | Admitting: Cardiology

## 2018-04-27 ENCOUNTER — Ambulatory Visit: Payer: BLUE CROSS/BLUE SHIELD | Admitting: Cardiology

## 2018-05-13 DIAGNOSIS — I10 Essential (primary) hypertension: Secondary | ICD-10-CM | POA: Diagnosis not present

## 2018-05-13 DIAGNOSIS — T63461A Toxic effect of venom of wasps, accidental (unintentional), initial encounter: Secondary | ICD-10-CM | POA: Diagnosis not present

## 2018-05-20 NOTE — Progress Notes (Signed)
Cardiology Office Note  Date: 05/23/2018   ID: Denise, Rowe May 24, 1951, MRN [id number]  PCP: Denise Housekeeper, MD  Primary Cardiologist: Denise Lesches, MD   Chief Complaint  Patient presents with  . PAF    History of Present Illness: Denise Rowe is a 67 y.o. female last seen in April.  She is here with her husband for a follow-up visit.  We discussed her home blood pressure checks.  She has fluctuations in systolics from the 664Q up into the 034V, diastolics in the 42-595 range.  She admits that she has gained weight, has not been exercising as regularly.  She has retired however and has much less stress.  We went over her medications.  She continues on Norvasc and Lopressor.  We discussed increasing her Lopressor dose somewhat, also discussed exercise plan and diet.  She continues on Xarelto with no reported bleeding episodes.  Lab work from April is outlined below.  Follow-up ischemic testing in May is outlined below, overall low risk.  Past Medical History:  Diagnosis Date  . Anxiety   . CKD (chronic kidney disease) stage 3, GFR 30-59 ml/min (HCC)   . Essential hypertension   . Hyperlipidemia   . PAF (paroxysmal atrial fibrillation) ([place])   . Type 2 diabetes mellitus ([place])   . Vitamin D deficiency     Past Surgical History:  Procedure Laterality Date  . CESAREAN SECTION      Current Outpatient Medications  Medication Sig Dispense Refill  . amLODipine (NORVASC) 5 MG tablet Take 5 mg by mouth daily.    . Vitamin D, Ergocalciferol, (DRISDOL) 50000 units CAPS capsule Take 1 capsule by mouth once a week.  1  . XARELTO 15 MG TABS tablet TAKE ONE TABLET (15 MG TOTAL) BY MOUTH DAILY. 30 tablet 10  . metoprolol tartrate (LOPRESSOR) 25 MG tablet Take 1 tablet (25 mg total) by mouth 3 (three) times daily. 90 tablet 3   No current facility-administered medications for this visit.    Allergies:  Patient has no known allergies.   Social History: The patient   reports that she has never smoked. She has never used smokeless tobacco. She reports that she does not drink alcohol or use drugs.   ROS:  Please see the history of present illness. Otherwise, complete review of systems is positive for anxiety about her health.  All other systems are reviewed and negative.   Physical Exam: VS:  BP (!) 157/90   Pulse 99   Ht 5\' 7"  (1.702 m)   Wt 242 lb 9.6 oz (110 kg)   SpO2 100%   BMI 38.00 kg/m , BMI Body mass index is 38 kg/m.  Wt Readings from Last 3 Encounters:  05/23/18 242 lb 9.6 oz (110 kg)  03/08/18 237 lb (107.5 kg)  03/04/18 239 lb (108.4 kg)    General: Patient appears comfortable at rest. HEENT: Conjunctiva and lids normal, oropharynx clear. Neck: Supple, no elevated JVP or carotid bruits, no thyromegaly. Lungs: Clear to auscultation, nonlabored breathing at rest. Cardiac: Irregularly irregular, no S3 or significant systolic murmur. Abdomen: Soft, nontender, bowel sounds present. Extremities: No pitting edema, distal pulses 2+. Skin: Warm and dry. Musculoskeletal: No kyphosis. Neuropsychiatric: Alert and oriented x3, affect grossly appropriate.  ECG: I personally reviewed the tracing from 03/04/2018 which showed atrial fibrillation.  Recent Labwork: 03/05/2018: BUN 38; Creatinine, Ser 1.79; Hemoglobin 14.7; Platelets 181; Potassium 4.0; Sodium 143   Other Studies Reviewed Today:  Echocardiogram 11/01/2015([facility]):  LVEF 60-65%, moderate left atrial enlargement, normal right ventricular contraction, mildly sclerotic aortic valve, RVSP estimated 15 mmHg.  Lexiscan Myoview 03/18/2018:  There was no ST segment deviation noted during stress. Atrial fibrillation seen throughout study.  Defect 1: There is a small defect of mild severity present in the apical anterior location. It is due to soft tissue attenuation artifact. No myocardial ischemia or scar.  The study is normal.  Nuclear stress EF: 72%.  Assessment and Plan:  1.   Essential hypertension.  Plan to increase Lopressor to 25 mg 3 times a day and otherwise continue current dose of Norvasc.  We discussed exercise plan and diet.  Continue to track blood pressure at home intermittently.  Keep follow-up with PCP.  2.  CKD stage III, creatinine 1.79 in April.  She is following with nephrology.  3.  Reassuring ischemic work-up, Myoview in May was low risk without obvious ischemia and normal LVEF.  Current medicines were reviewed with the patient today.  Disposition: Follow-up in 6 months, sooner if needed.  Signed, Satira Sark, MD, [facility] 05/23/2018 4:21 PM    [facility]e at [place], [place], [place] [address] Phone: [phone]; Fax: [phone]

## 2018-05-23 ENCOUNTER — Encounter

## 2018-05-23 ENCOUNTER — Encounter: Payer: Self-pay | Admitting: Cardiology

## 2018-05-23 ENCOUNTER — Ambulatory Visit (INDEPENDENT_AMBULATORY_CARE_PROVIDER_SITE_OTHER): Payer: BLUE CROSS/BLUE SHIELD | Admitting: Cardiology

## 2018-05-23 VITALS — BP 157/90 | HR 99 | Ht 67.0 in | Wt 242.6 lb

## 2018-05-23 DIAGNOSIS — N183 Chronic kidney disease, stage 3 unspecified: Secondary | ICD-10-CM

## 2018-05-23 DIAGNOSIS — I481 Persistent atrial fibrillation: Secondary | ICD-10-CM | POA: Diagnosis not present

## 2018-05-23 DIAGNOSIS — I10 Essential (primary) hypertension: Secondary | ICD-10-CM

## 2018-05-23 DIAGNOSIS — I4819 Other persistent atrial fibrillation: Secondary | ICD-10-CM

## 2018-05-23 MED ORDER — METOPROLOL TARTRATE 25 MG PO TABS: 25.0000 mg | ORAL_TABLET | Freq: Three times a day (TID) | ORAL | 3 refills | Status: DC

## 2018-05-23 NOTE — Patient Instructions (Signed)
Medication Instructions:  Your physician has recommended you make the following change in your medication:   INCREASE Lopressor to 25 mg THREE TIMES A DAY   Please continue all other medications as prescribed  Labwork: NONE  Testing/Procedures: NONE  Follow-Up: Your physician wants you to follow-up in: [place]. You will receive a reminder letter in the mail two months in advance. If you don't receive a letter, please call our office to schedule the follow-up appointment.  Any Other Special Instructions Will Be Listed Below (If Applicable).  If you need a refill on your cardiac medications before your next appointment, please call your pharmacy.

## 2018-06-09 DIAGNOSIS — N184 Chronic kidney disease, stage 4 (severe): Secondary | ICD-10-CM | POA: Diagnosis not present

## 2018-06-09 DIAGNOSIS — R8290 Unspecified abnormal findings in urine: Secondary | ICD-10-CM | POA: Diagnosis not present

## 2018-06-09 DIAGNOSIS — E559 Vitamin D deficiency, unspecified: Secondary | ICD-10-CM | POA: Diagnosis not present

## 2018-06-16 DIAGNOSIS — I10 Essential (primary) hypertension: Secondary | ICD-10-CM | POA: Diagnosis not present

## 2018-06-16 DIAGNOSIS — N184 Chronic kidney disease, stage 4 (severe): Secondary | ICD-10-CM | POA: Diagnosis not present

## 2018-06-30 DIAGNOSIS — N184 Chronic kidney disease, stage 4 (severe): Secondary | ICD-10-CM | POA: Diagnosis not present

## 2018-08-18 DIAGNOSIS — M25561 Pain in right knee: Secondary | ICD-10-CM | POA: Diagnosis not present

## 2018-08-18 DIAGNOSIS — M1711 Unilateral primary osteoarthritis, right knee: Secondary | ICD-10-CM | POA: Diagnosis not present

## 2018-09-12 DIAGNOSIS — N184 Chronic kidney disease, stage 4 (severe): Secondary | ICD-10-CM | POA: Diagnosis not present

## 2018-09-20 DIAGNOSIS — I10 Essential (primary) hypertension: Secondary | ICD-10-CM | POA: Diagnosis not present

## 2018-09-20 DIAGNOSIS — N2581 Secondary hyperparathyroidism of renal origin: Secondary | ICD-10-CM | POA: Diagnosis not present

## 2018-09-20 DIAGNOSIS — N184 Chronic kidney disease, stage 4 (severe): Secondary | ICD-10-CM | POA: Diagnosis not present

## 2018-10-06 ENCOUNTER — Other Ambulatory Visit: Payer: Self-pay | Admitting: Cardiology

## 2018-10-19 DIAGNOSIS — N2581 Secondary hyperparathyroidism of renal origin: Secondary | ICD-10-CM | POA: Diagnosis not present

## 2018-10-19 DIAGNOSIS — N184 Chronic kidney disease, stage 4 (severe): Secondary | ICD-10-CM | POA: Diagnosis not present

## 2018-10-24 DIAGNOSIS — N184 Chronic kidney disease, stage 4 (severe): Secondary | ICD-10-CM | POA: Diagnosis not present

## 2018-10-24 DIAGNOSIS — N2581 Secondary hyperparathyroidism of renal origin: Secondary | ICD-10-CM | POA: Diagnosis not present

## 2018-10-24 DIAGNOSIS — I10 Essential (primary) hypertension: Secondary | ICD-10-CM | POA: Diagnosis not present

## 2018-11-01 DIAGNOSIS — N186 End stage renal disease: Secondary | ICD-10-CM | POA: Diagnosis not present

## 2018-11-01 DIAGNOSIS — Z01818 Encounter for other preprocedural examination: Secondary | ICD-10-CM | POA: Diagnosis not present

## 2018-11-01 DIAGNOSIS — I120 Hypertensive chronic kidney disease with stage 5 chronic kidney disease or end stage renal disease: Secondary | ICD-10-CM | POA: Diagnosis not present

## 2018-11-02 ENCOUNTER — Other Ambulatory Visit: Payer: Self-pay | Admitting: Cardiology

## 2018-11-25 ENCOUNTER — Ambulatory Visit: Payer: BLUE CROSS/BLUE SHIELD | Admitting: Cardiology

## 2019-01-06 ENCOUNTER — Ambulatory Visit: Payer: Self-pay | Admitting: Cardiology

## 2019-02-10 DIAGNOSIS — N184 Chronic kidney disease, stage 4 (severe): Secondary | ICD-10-CM | POA: Diagnosis not present

## 2019-02-15 DIAGNOSIS — I10 Essential (primary) hypertension: Secondary | ICD-10-CM | POA: Diagnosis not present

## 2019-02-15 DIAGNOSIS — N2581 Secondary hyperparathyroidism of renal origin: Secondary | ICD-10-CM | POA: Diagnosis not present

## 2019-02-15 DIAGNOSIS — N184 Chronic kidney disease, stage 4 (severe): Secondary | ICD-10-CM | POA: Diagnosis not present

## 2019-03-31 ENCOUNTER — Other Ambulatory Visit: Payer: Self-pay | Admitting: Cardiology

## 2019-04-23 ENCOUNTER — Other Ambulatory Visit: Payer: Self-pay | Admitting: Cardiology

## 2019-05-02 DIAGNOSIS — I10 Essential (primary) hypertension: Secondary | ICD-10-CM | POA: Diagnosis not present

## 2019-05-02 DIAGNOSIS — E785 Hyperlipidemia, unspecified: Secondary | ICD-10-CM | POA: Diagnosis not present

## 2019-05-02 DIAGNOSIS — F411 Generalized anxiety disorder: Secondary | ICD-10-CM | POA: Diagnosis not present

## 2019-05-02 DIAGNOSIS — E559 Vitamin D deficiency, unspecified: Secondary | ICD-10-CM | POA: Diagnosis not present

## 2019-06-13 NOTE — Progress Notes (Signed)
Cardiology Office Note  Date: 06/14/2019   ID: Denise Rowe, Denise Rowe 1951-05-02, MRN [id number]  PCP:  Dione Housekeeper, MD  Cardiologist:  Rozann Lesches, MD Electrophysiologist:  None   Chief Complaint  Patient presents with  . Cardiac follow-up    History of Present Illness: Denise Rowe is a 68 y.o. female last seen in July 2019.  She presents for a routine follow-up visit.  She states that she has been doing relatively well, has lost a significant amount of weight through diet and has been walking regularly for exercise.  She feels like she has more energy.  She reports only an occasional sense of palpitations, no exertional chest pain or unusual shortness of breath.  I reviewed her lab work from June as outlined below.  She still follows with a nephrologist.  She does not report any bleeding problems on Xarelto at 15 mg daily.  I personally reviewed her ECG today which shows rate controlled atrial fibrillation.  Past Medical History:  Diagnosis Date  . Anxiety   . CKD (chronic kidney disease) stage 3, GFR 30-59 ml/min (HCC)   . Essential hypertension   . Hyperlipidemia   . PAF (paroxysmal atrial fibrillation) ([place])   . Type 2 diabetes mellitus ([place])   . Vitamin D deficiency     Past Surgical History:  Procedure Laterality Date  . CESAREAN SECTION      Current Outpatient Medications  Medication Sig Dispense Refill  . amLODipine (NORVASC) 5 MG tablet Take 5 mg by mouth daily.    . calcitRIOL (ROCALTROL) 0.25 MCG capsule Take by mouth.    . metoprolol tartrate (LOPRESSOR) 25 MG tablet Take 25 mg by mouth 2 (two) times daily.    Alveda Reasons 15 MG TABS tablet TAKE 1 TABLET BY MOUTH EVERY DAY 30 tablet 1   No current facility-administered medications for this visit.    Allergies:  Patient has no known allergies.   Social History: The patient  reports that she has never smoked. She has never used smokeless tobacco. She reports that she does not drink alcohol  or use drugs.   ROS:  Please see the history of present illness. Otherwise, complete review of systems is positive for chronic anxiety.  All other systems are reviewed and negative.   Physical Exam: VS:  BP (!) 155/84   Pulse 74   Temp (!) 96.9 F (36.1 C)   Ht 5\' 7"  (1.702 m)   Wt 207 lb (93.9 kg)   BMI 32.42 kg/m , BMI Body mass index is 32.42 kg/m.  Wt Readings from Last 3 Encounters:  06/14/19 207 lb (93.9 kg)  05/23/18 242 lb 9.6 oz (110 kg)  03/08/18 237 lb (107.5 kg)    General: Patient appears comfortable at rest. HEENT: Conjunctiva and lids normal, wearing a mask. Neck: Supple, no elevated JVP or carotid bruits, no thyromegaly. Lungs: Clear to auscultation, nonlabored breathing at rest. Cardiac: Irregularly irregular, no S3 or significant systolic murmur. Abdomen: Soft, nontender, bowel sounds present. Extremities: No pitting edema, distal pulses 2+. Skin: Warm and dry. Musculoskeletal: No kyphosis. Neuropsychiatric: Alert and oriented x3, affect grossly appropriate.  ECG:  An ECG dated 03/07/2018 was personally reviewed today and demonstrated:  Atrial fibrillation.  Recent Labwork:  03/05/2018: BUN 38; Creatinine, Ser 1.79; Hemoglobin 14.7; Platelets 181; Potassium 4.0; Sodium 143  June 2020: Cholesterol 198, triglycerides 94, HDL 57, LDL 122, BUN 38, creatinine 2.39, potassium 5.1, AST 25, ALT 19, hemoglobin 15.2, platelets 225  Other Studies Reviewed Today:  Echocardiogram 11/01/2015([facility]): LVEF 60-65%, moderate left atrial enlargement, normal right ventricular contraction, mildly sclerotic aortic valve, RVSP estimated 15 mmHg.  Lexiscan Myoview 03/18/2018:  There was no ST segment deviation noted during stress. Atrial fibrillation seen throughout study.  Defect 1: There is a small defect of mild severity present in the apical anterior location. It is due to soft tissue attenuation artifact. No myocardial ischemia or scar.  The study is normal.   Nuclear stress EF: 72%.  Assessment and Plan:  1.  Persistent atrial fibrillation, managing as permanent atrial fibrillation with heart rate control and anticoagulation..  She continues on Lopressor and renally dosed Xarelto without bleeding problems.  No changes were made today.  2.  CKD stage III, last creatinine 2.39 in June.  She continues to follow with nephrology.  3.  Essential hypertension, blood pressure elevated today, but she states that she does get better measurements at home.  She remains on Norvasc and Lopressor.  Keep follow-up with PCP.  She has also been focused on weight loss and exercise.  Medication Adjustments/Labs and Tests Ordered: Current medicines are reviewed at length with the patient today.  Concerns regarding medicines are outlined above.   Tests Ordered: Orders Placed This Encounter  Procedures  . EKG 12-Lead    Medication Changes: No orders of the defined types were placed in this encounter.   Disposition:  Follow up 1 year in the [place] office.  Signed, Satira Sark, MD, [facility] 06/14/2019 9:55 AM    [facility] at [place]. [address] [place], [place] [address] Phone: [phone]; Fax: [phone]

## 2019-06-14 ENCOUNTER — Other Ambulatory Visit: Payer: Self-pay

## 2019-06-14 ENCOUNTER — Ambulatory Visit: Payer: Self-pay | Admitting: Cardiology

## 2019-06-14 ENCOUNTER — Ambulatory Visit (INDEPENDENT_AMBULATORY_CARE_PROVIDER_SITE_OTHER): Payer: Self-pay | Admitting: Cardiology

## 2019-06-14 ENCOUNTER — Encounter: Payer: Self-pay | Admitting: Cardiology

## 2019-06-14 VITALS — BP 155/84 | HR 74 | Temp 96.9°F | Ht 67.0 in | Wt 207.0 lb

## 2019-06-14 DIAGNOSIS — I10 Essential (primary) hypertension: Secondary | ICD-10-CM

## 2019-06-14 DIAGNOSIS — N183 Chronic kidney disease, stage 3 unspecified: Secondary | ICD-10-CM

## 2019-06-14 DIAGNOSIS — I4819 Other persistent atrial fibrillation: Secondary | ICD-10-CM

## 2019-06-14 NOTE — Patient Instructions (Signed)
Medication Instructions:  Your physician recommends that you continue on your current medications as directed. Please refer to the Current Medication list given to you today.  Labwork: None today  Procedures/Testing: None today  Follow-Up: 1 year with Dr.McDowell  Any Additional Special Instructions Will Be Listed Below (If Applicable).     If you need a refill on your cardiac medications before your next appointment, please call your pharmacy.    Thank you for choosing [facility] !        

## 2019-06-15 DIAGNOSIS — N184 Chronic kidney disease, stage 4 (severe): Secondary | ICD-10-CM | POA: Diagnosis not present

## 2019-06-20 DIAGNOSIS — N184 Chronic kidney disease, stage 4 (severe): Secondary | ICD-10-CM | POA: Diagnosis not present

## 2019-06-20 DIAGNOSIS — N2581 Secondary hyperparathyroidism of renal origin: Secondary | ICD-10-CM | POA: Diagnosis not present

## 2019-06-20 DIAGNOSIS — I10 Essential (primary) hypertension: Secondary | ICD-10-CM | POA: Diagnosis not present

## 2019-06-22 ENCOUNTER — Other Ambulatory Visit: Payer: Self-pay | Admitting: Cardiology

## 2019-07-07 ENCOUNTER — Ambulatory Visit: Payer: Self-pay | Admitting: Student

## 2019-08-16 ENCOUNTER — Other Ambulatory Visit: Payer: Self-pay | Admitting: Cardiology

## 2019-12-21 ENCOUNTER — Telehealth: Payer: Self-pay | Admitting: Cardiology

## 2019-12-21 NOTE — Telephone Encounter (Signed)
Called pt. Informed her that we have not received the prior authorization from [facility]. Let her know she may need to pick up samples of xarelto so that she does not run out. She will call [facility] to tell them to send prior authorization.

## 2019-12-21 NOTE — Telephone Encounter (Signed)
Pt's ins has changed from [place] to [place]-- and they're needing a prior auth for her XARELTO 15 MG TABS tablet [id number]    Pt has about 5 pills left before she is completely out.   Please give pt a call [phone]

## 2019-12-22 ENCOUNTER — Other Ambulatory Visit: Payer: Self-pay | Admitting: Cardiology

## 2019-12-22 ENCOUNTER — Other Ambulatory Visit: Payer: Self-pay

## 2019-12-22 MED ORDER — RIVAROXABAN 15 MG PO TABS: 15.0000 mg | ORAL_TABLET | Freq: Every day | ORAL | 6 refills | Status: DC

## 2019-12-22 NOTE — Telephone Encounter (Signed)
Prior Authorization [facility].

## 2019-12-22 NOTE — Telephone Encounter (Signed)
[facility]ed [facility].

## 2019-12-22 NOTE — Telephone Encounter (Signed)
[employer]. From [place]. Pt's zorelto need authorization.  Thanks renee

## 2019-12-22 NOTE — Telephone Encounter (Signed)
° ° ° °  1. Which medications need to be refilled? (please list name of each medication and dose if known) XARELTO 15 MG TABS tablet    2. Which pharmacy/location (including street and city if local pharmacy) is medication to be sent to?   [facility] [place]   3. Do they need a 30 day or 90 day supply?

## 2019-12-22 NOTE — Telephone Encounter (Signed)
Refill sent.

## 2019-12-26 NOTE — Telephone Encounter (Signed)
Pharmacy notified prior authorization is complete. Pt notified.

## 2019-12-26 NOTE — Telephone Encounter (Signed)
Prior authorization complete. Pt approved. Case # [id number]

## 2020-06-18 ENCOUNTER — Ambulatory Visit: Payer: Self-pay | Admitting: Cardiology

## 2020-07-07 NOTE — Progress Notes (Signed)
Cardiology Office Note  Date: 07/08/2020   ID: Denise, Rowe 09-Feb-1951, MRN [id number]  PCP:  Patient, No Pcp Per  Cardiologist:  Rozann Lesches, MD Electrophysiologist:  None   Chief Complaint  Patient presents with  . Cardiac follow-up    History of Present Illness: Denise Rowe is a 69 y.o. female last seen in July 2020.  She presents for a routine visit.  She states that she has been doing well overall, walking regularly for exercise, no sense of palpitations or breathlessness with typical activities.  She continues to follow with her nephrologist in [place], in fact is due for follow-up lab work and a visit later this week.  She recalls her most recent creatinine being 2.7.  I reviewed her medications which are outlined below.  She does not report any bleeding problems on Xarelto 15 mg daily.  Diastolic blood pressure today was increased, she checks her blood pressure regularly at home and reports normal measurements.  I asked her just to keep an eye on this, no medication changes made.  I personally reviewed her ECG today which shows rate controlled atrial fibrillation, rightward axis.  Past Medical History:  Diagnosis Date  . Anxiety   . CKD (chronic kidney disease) stage 3, GFR 30-59 ml/min   . Essential hypertension   . Hyperlipidemia   . PAF (paroxysmal atrial fibrillation) ([place])   . Type 2 diabetes mellitus ([place])   . Vitamin D deficiency     Past Surgical History:  Procedure Laterality Date  . CESAREAN SECTION      Current Outpatient Medications  Medication Sig Dispense Refill  . amLODipine (NORVASC) 5 MG tablet Take 5 mg by mouth daily.    . calcitRIOL (ROCALTROL) 0.25 MCG capsule Take by mouth.    . metoprolol tartrate (LOPRESSOR) 25 MG tablet Take 25 mg by mouth 2 (two) times daily.    . Rivaroxaban (XARELTO) 15 MG TABS tablet Take 1 tablet (15 mg total) by mouth daily. 30 tablet 6   No current facility-administered medications  for this visit.   Allergies:  Patient has no known allergies.   ROS:  No dizziness or syncope.  Physical Exam: VS:  BP (!) 132/94   Pulse 85   Ht 5\' 7"  (1.702 m)   Wt 206 lb (93.4 kg)   BMI 32.26 kg/m , BMI Body mass index is 32.26 kg/m.  Wt Readings from Last 3 Encounters:  07/08/20 206 lb (93.4 kg)  06/14/19 207 lb (93.9 kg)  05/23/18 242 lb 9.6 oz (110 kg)    General: Patient appears comfortable at rest. HEENT: Conjunctiva and lids normal, wearing a mask. Neck: Supple, no elevated JVP or carotid bruits, no thyromegaly. Lungs: Clear to auscultation, nonlabored breathing at rest. Cardiac: Irregularly irregular, no gallop. Extremities: No pitting edema, distal pulses 2+.  ECG:  An ECG dated 06/14/2019 was personally reviewed today and demonstrated:  Atrial fibrillation.  Recent Labwork:  June 2020: Cholesterol 198, triglycerides 94, HDL 57, LDL 122, BUN 38, creatinine 2.39, potassium 5.1, AST 25, ALT 19, hemoglobin 15.2, platelets 225  Other Studies Reviewed Today:  Echocardiogram 11/01/2015([facility]): LVEF 60-65%, moderate left atrial enlargement, normal right ventricular contraction, mildly sclerotic aortic valve, RVSP estimated 15 mmHg.  Lexiscan Myoview 03/18/2018:  There was no ST segment deviation noted during stress. Atrial fibrillation seen throughout study.  Defect 1: There is a small defect of mild severity present in the apical anterior location. It is due to soft tissue attenuation  artifact. No myocardial ischemia or scar.  The study is normal.  Nuclear stress EF: 72%.  Assessment and Plan:  1.  Permanent atrial fibrillation, CHA2DS2-VASc score is 4.  She is asymptomatic, no reported palpitations or dyspnea exertion.  Continue Lopressor and renally dosed Xarelto.  She is due for follow-up lab work with nephrologist later this week.  No reported bleeding problems.  ECG reviewed today.  2.  CKD stage IIIb, she continues to follow with nephrology.  3.   Essential hypertension, diastolic elevated today, but reportedly home measurements have been quite good.  No change in current regimen including Norvasc and Lopressor.  She continues to walk regularly and has lost weight through diet.  Medication Adjustments/Labs and Tests Ordered: Current medicines are reviewed at length with the patient today.  Concerns regarding medicines are outlined above.   Tests Ordered: Orders Placed This Encounter  Procedures  . EKG 12-Lead    Medication Changes: No orders of the defined types were placed in this encounter.   Disposition:  Follow up 1 year in the [place] office.  Signed, Satira Sark, MD, [facility] 07/08/2020 9:59 AM    [facility] at [place], [place], [place] [address] Phone: [phone]; Fax: [phone]

## 2020-07-08 ENCOUNTER — Other Ambulatory Visit: Payer: Self-pay

## 2020-07-08 ENCOUNTER — Ambulatory Visit (INDEPENDENT_AMBULATORY_CARE_PROVIDER_SITE_OTHER): Payer: Managed Care, Other (non HMO) | Admitting: Cardiology

## 2020-07-08 ENCOUNTER — Encounter: Payer: Self-pay | Admitting: Cardiology

## 2020-07-08 VITALS — BP 132/94 | HR 85 | Ht 67.0 in | Wt 206.0 lb

## 2020-07-08 DIAGNOSIS — N1832 Chronic kidney disease, stage 3b: Secondary | ICD-10-CM | POA: Diagnosis not present

## 2020-07-08 DIAGNOSIS — I10 Essential (primary) hypertension: Secondary | ICD-10-CM | POA: Diagnosis not present

## 2020-07-08 DIAGNOSIS — I4819 Other persistent atrial fibrillation: Secondary | ICD-10-CM | POA: Diagnosis not present

## 2020-07-08 NOTE — Patient Instructions (Addendum)
Medication Instructions:   Your physician recommends that you continue on your current medications as directed. Please refer to the Current Medication list given to you today.  Labwork:  NONE  Testing/Procedures:  NONE  Follow-Up:  Your physician recommends that you schedule a follow-up appointment in: 1 year in [place]. You will receive a reminder letter in the mail in about 10 months reminding you to call and schedule your appointment. If you don't receive this letter, please contact our office.  Any Other Special Instructions Will Be Listed Below (If Applicable).  If you need a refill on your cardiac medications before your next appointment, please call your pharmacy.

## 2020-08-18 ENCOUNTER — Other Ambulatory Visit: Payer: Self-pay | Admitting: Cardiology

## 2021-03-11 ENCOUNTER — Other Ambulatory Visit: Payer: Self-pay | Admitting: Cardiology

## 2021-03-11 NOTE — Telephone Encounter (Signed)
Prescription refill request for Xarelto received.  Indication: PAF Last office visit: 07/08/20 Weight: 93.4kg Age: 70 Scr: 2.65 CrCl: 29.96  Based on above findings Xarelto '15mg'$  daily (renal dose) is the appropriate dose.  Refill approved.

## 2021-04-05 ENCOUNTER — Other Ambulatory Visit: Payer: Self-pay | Admitting: Cardiology

## 2021-04-07 ENCOUNTER — Telehealth: Payer: Self-pay | Admitting: Cardiology

## 2021-04-07 ENCOUNTER — Other Ambulatory Visit: Payer: Self-pay

## 2021-04-07 MED ORDER — RIVAROXABAN 15 MG PO TABS: 15.0000 mg | ORAL_TABLET | Freq: Every day | ORAL | 1 refills | Status: DC

## 2021-04-07 MED ORDER — METOPROLOL TARTRATE 25 MG PO TABS
25.0000 mg | ORAL_TABLET | Freq: Two times a day (BID) | ORAL | 3 refills | Status: DC
Start: 2021-04-07 — End: 2021-04-07

## 2021-04-07 MED ORDER — METOPROLOL TARTRATE 25 MG PO TABS: 25.0000 mg | ORAL_TABLET | Freq: Two times a day (BID) | ORAL | 1 refills | Status: DC

## 2021-04-07 NOTE — Telephone Encounter (Signed)
Done

## 2021-04-07 NOTE — Telephone Encounter (Signed)
Pharmacy refill request lopressor 25 mg TID, per office note, patient should be taking BID, refilled at BID

## 2021-04-07 NOTE — Telephone Encounter (Signed)
   1. Which medications need to be refilled? (please list name of each medication and dose if known)  1. XARELTO 15 MG 2. LOPRESSOR 25 MG   2. Which pharmacy/location (including street and city if local pharmacy) is medication to be [facility] [place]   3. Do they need a 30 day or 90 day supply? 90  Patient has upcoming appointment with Dr. Domenic Polite October 2022

## 2021-08-22 ENCOUNTER — Encounter: Payer: Self-pay | Admitting: *Deleted

## 2021-08-24 NOTE — Progress Notes (Signed)
Cardiology Office Note  Date: 08/25/2021   ID: Callyn, Krick 11-22-1950, MRN [id number]  PCP:  Patient, No Pcp Per (Inactive)  Cardiologist:  Rozann Lesches, MD Electrophysiologist:  None   Chief Complaint  Patient presents with   Cardiac follow-up    History of Present Illness: Denise Rowe is a 70 y.o. female last seen in August 2021.  She is here for a follow-up visit.  From a cardiac perspective, she does not report any progressive sense of palpitations, no exertional chest pain.  She states that she is active, gets anywhere between 8000 and 9000 steps on most days.  She works as an Cytogeneticist.  She follows at [facility] for chronic kidney disease.  She has CKD stage IV-V, history of atrophic right kidney.  Lab work in February revealed creatinine 2.65.  We went over her medications, she states that these have been simplified since our last encounter (no longer on Norvasc), she remains consistent with Xarelto and Lopressor.  Blood pressure was significantly elevated when she came in this morning, she stated that when she checked it at home this morning it was 127/66 and generally is in that range.  I have asked her to follow this closely.  She does not report any spontaneous bleeding problems on Xarelto.  Past Medical History:  Diagnosis Date   Anxiety    CKD (chronic kidney disease) stage 3, GFR 30-59 ml/min (HCC)    Essential hypertension    Hyperlipidemia    PAF (paroxysmal atrial fibrillation) (HCC)    Type 2 diabetes mellitus (HCC)    Vitamin D deficiency     Past Surgical History:  Procedure Laterality Date   CESAREAN SECTION      Current Outpatient Medications  Medication Sig Dispense Refill   metoprolol tartrate (LOPRESSOR) 25 MG tablet Take 1 tablet (25 mg total) by mouth 2 (two) times daily. 180 tablet 1   Rivaroxaban (XARELTO) 15 MG TABS tablet Take 1 tablet (15 mg total) by mouth daily. 90 tablet 1    No current facility-administered medications for this visit.   Allergies:  Patient has no known allergies.   ROS: No syncope.  Physical Exam: VS:  BP (!) 150/98 (BP Location: Left Arm, Cuff Size: Large)   Pulse 72   Ht '5\' 7"'$  (1.702 m)   Wt 208 lb 3.2 oz (94.4 kg)   BMI 32.61 kg/m , BMI Body mass index is 32.61 kg/m.  Wt Readings from Last 3 Encounters:  08/25/21 208 lb 3.2 oz (94.4 kg)  07/08/20 206 lb (93.4 kg)  06/14/19 207 lb (93.9 kg)    General: Patient appears comfortable at rest. HEENT: Conjunctiva and lids normal, wearing a mask. Neck: Supple, no elevated JVP or carotid bruits, no thyromegaly. Lungs: Clear to auscultation, nonlabored breathing at rest. Cardiac: Irregularly irregular, no S3 or significant systolic murmur. Extremities: No pitting edema.  ECG:  An ECG dated 07/08/2020 was personally reviewed today and demonstrated:  Atrial fibrillation with rightward axis.  Recent Labwork:  February 2022: Hemoglobin 13.2, platelets 174, potassium 4.6, BUN 20, creatinine 2.65, AST 19, ALT 13, LDL 130, total cholesterol 184, triglycerides 123, HDL 48  Other Studies Reviewed Today:  Echocardiogram 11/01/2015 [facility]): LVEF 60-65%, moderate left atrial enlargement, normal right ventricular contraction, mildly sclerotic aortic valve, RVSP estimated 15 mmHg.   Lexiscan Myoview 03/18/2018: There was no ST segment deviation noted during stress. Atrial fibrillation seen throughout study. Defect 1: There is a  small defect of mild severity present in the apical anterior location. It is due to soft tissue attenuation artifact. No myocardial ischemia or scar. The study is normal. Nuclear stress EF: 72%.  Assessment and Plan:  1.  Permanent atrial fibrillation with CHA2DS2-VASc score of 4.  She does not report any progressive palpitations and is doing well on Lopressor along with Xarelto.  I reviewed her interval lab work as noted above.  She does not report any spontaneous  bleeding problems.  2.  CKD stage IV-V, following at [facility].  Last creatinine 2.65.  3.  Essential hypertension.  She is now off Norvasc and states that blood pressure checks at home are in good range as reported above.  Today's blood pressure in the office was much higher.  I have asked her to keep a close eye on this.  She does not follow with a PCP.  Medication Adjustments/Labs and Tests Ordered: Current medicines are reviewed at length with the patient today.  Concerns regarding medicines are outlined above.   Tests Ordered: No orders of the defined types were placed in this encounter.   Medication Changes: No orders of the defined types were placed in this encounter.   Disposition:  Follow up  1 year.  Signed, Satira Sark, MD, [facility] 08/25/2021 9:40 AM    [facility] at [place], [place], [place] [address] Phone: [phone]; Fax: [phone]

## 2021-08-25 ENCOUNTER — Encounter: Payer: Self-pay | Admitting: Cardiology

## 2021-08-25 ENCOUNTER — Ambulatory Visit (INDEPENDENT_AMBULATORY_CARE_PROVIDER_SITE_OTHER): Payer: Managed Care, Other (non HMO) | Admitting: Cardiology

## 2021-08-25 VITALS — BP 150/98 | HR 72 | Ht 67.0 in | Wt 208.2 lb

## 2021-08-25 DIAGNOSIS — I4819 Other persistent atrial fibrillation: Secondary | ICD-10-CM | POA: Diagnosis not present

## 2021-08-25 DIAGNOSIS — N184 Chronic kidney disease, stage 4 (severe): Secondary | ICD-10-CM | POA: Diagnosis not present

## 2021-08-25 DIAGNOSIS — I10 Essential (primary) hypertension: Secondary | ICD-10-CM

## 2021-08-25 NOTE — Patient Instructions (Signed)

## 2021-12-17 ENCOUNTER — Other Ambulatory Visit: Payer: Self-pay | Admitting: Cardiology

## 2022-03-21 ENCOUNTER — Other Ambulatory Visit: Payer: Self-pay | Admitting: Cardiology

## 2022-03-23 NOTE — Telephone Encounter (Signed)
Prescription refill request for Xarelto received.  ?Indication: Atrial Fib ?Last office visit: 08/25/21  Myles Gip MD ?Weight: 94.4kg ?Age: 71 ?Scr: 2.65 ?CrCl: 29.86 ? ?Based on above findings Xarelto 15mg  daily is the appropriate dose.  Refill approved. ? ?

## 2022-03-27 ENCOUNTER — Encounter (HOSPITAL_COMMUNITY): Payer: Self-pay | Admitting: Emergency Medicine

## 2022-03-27 ENCOUNTER — Emergency Department (HOSPITAL_COMMUNITY)
Admission: EM | Admit: 2022-03-27 | Discharge: 2022-03-27 | Disposition: A | Discharge status: Home / Self Care | Payer: BC Managed Care – PPO | Attending: Emergency Medicine | Admitting: Emergency Medicine

## 2022-03-27 ENCOUNTER — Other Ambulatory Visit: Payer: Self-pay

## 2022-03-27 ENCOUNTER — Emergency Department (HOSPITAL_COMMUNITY): Payer: BC Managed Care – PPO

## 2022-03-27 DIAGNOSIS — Z7901 Long term (current) use of anticoagulants: Secondary | ICD-10-CM | POA: Insufficient documentation

## 2022-03-27 DIAGNOSIS — S61211A Laceration without foreign body of left index finger without damage to nail, initial encounter: Secondary | ICD-10-CM | POA: Diagnosis not present

## 2022-03-27 DIAGNOSIS — S62631B Displaced fracture of distal phalanx of left index finger, initial encounter for open fracture: Secondary | ICD-10-CM | POA: Diagnosis not present

## 2022-03-27 DIAGNOSIS — S62631A Displaced fracture of distal phalanx of left index finger, initial encounter for closed fracture: Secondary | ICD-10-CM | POA: Diagnosis not present

## 2022-03-27 DIAGNOSIS — S6992XA Unspecified injury of left wrist, hand and finger(s), initial encounter: Secondary | ICD-10-CM | POA: Diagnosis not present

## 2022-03-27 DIAGNOSIS — W268XXA Contact with other sharp object(s), not elsewhere classified, initial encounter: Secondary | ICD-10-CM | POA: Insufficient documentation

## 2022-03-27 DIAGNOSIS — Z23 Encounter for immunization: Secondary | ICD-10-CM | POA: Diagnosis not present

## 2022-03-27 DIAGNOSIS — M7989 Other specified soft tissue disorders: Secondary | ICD-10-CM | POA: Diagnosis not present

## 2022-03-27 MED ORDER — TETANUS-DIPHTH-ACELL PERTUSSIS 5-2.5-18.5 LF-MCG/0.5 IM SUSY
0.5000 mL | PREFILLED_SYRINGE | Freq: Once | INTRAMUSCULAR | Status: AC
  Administered 2022-03-27: 0.5 mL via INTRAMUSCULAR
  Filled 2022-03-27: qty 0.5

## 2022-03-27 MED ORDER — POVIDONE-IODINE 10 % EX SOLN
CUTANEOUS | Status: AC
  Filled 2022-03-27: qty 14.8

## 2022-03-27 MED ORDER — LIDOCAINE HCL (PF) 2 % IJ SOLN
5.0000 mL | Freq: Once | INTRAMUSCULAR | Status: AC
Start: 2022-03-27 — End: 2022-03-27

## 2022-03-27 MED ORDER — HYDROCODONE-ACETAMINOPHEN 5-325 MG PO TABS: 1.0000 | ORAL_TABLET | Freq: Four times a day (QID) | ORAL | 0 refills | Status: DC | PRN

## 2022-03-27 MED ORDER — CEPHALEXIN 250 MG PO CAPS: 250.0000 mg | ORAL_CAPSULE | Freq: Three times a day (TID) | ORAL | 0 refills | Status: AC

## 2022-03-27 MED ORDER — LIDOCAINE HCL (PF) 2 % IJ SOLN
INTRAMUSCULAR | Status: AC
  Administered 2022-03-27: 5 mL
  Filled 2022-03-27: qty 10

## 2022-03-27 NOTE — ED Notes (Signed)
Dressing has been changed twice already due to bleeding through it. Entire pack of 4x4 gauze pads wrapped around site each time.  ?

## 2022-03-27 NOTE — Discharge Instructions (Signed)
Keep your wound clean and dry, covered at all times.  Take the entire course of the antibiotics prescribed.  You have been also prescribed pain medication for use if needed, this will make you drowsy so use caution with this medication, do not drive within 4 hours of taking the pain medication.  Plan to see Dr. Aline Brochure early next week for further management of this fracture injury. ?

## 2022-03-27 NOTE — ED Notes (Signed)
ED Provider at bedside. 

## 2022-03-27 NOTE — ED Notes (Signed)
Suture cart at bedside 

## 2022-03-27 NOTE — ED Triage Notes (Addendum)
Pt cut LT index finger with an edger. Bleeding controlled with pressure. Pt is on xarelto. ?

## 2022-03-30 ENCOUNTER — Telehealth: Payer: Self-pay | Admitting: Orthopedic Surgery

## 2022-03-30 ENCOUNTER — Encounter (HOSPITAL_COMMUNITY): Payer: Self-pay | Admitting: Emergency Medicine

## 2022-03-30 NOTE — Telephone Encounter (Signed)
Called patient, aware of appointment. ?

## 2022-03-30 NOTE — Telephone Encounter (Addendum)
Voice message + call from patient following Emergency room visit for problem of Open Displaced Fracture of distal phalanx, left index finger. Patient states she is doing well and needs an appointment with Dr Aline Brochure. Please review and advise per current schedules. Patient states she does need to be scheduled for a morning appointment time. ?

## 2022-03-30 NOTE — ED Provider Notes (Signed)
?[facility] ?Provider Note ? ? ?CSN: [id number] ?Arrival date & time: 03/27/22  1138 ? ?  ? ?History ? ?Chief Complaint  ?Patient presents with  ? Extremity Laceration  ? ? ?Denise Rowe is a 71 y.o. female presenting for evaluation of fingertip laceration occurring just prior to arrival.  She was using a yard edger when the injury occurred.  The wound bled copiously, she is on Xarelto secondary to a fib.  She has held pressure with improved hemostasis.  Denies any other injury.  Pt is right handed.  Unsure of tetanus status.  ? ?The history is provided by the patient.  ? ?  ? ?Home Medications ?Prior to Admission medications   ?Medication Sig Start Date End Date Taking? Authorizing Provider  ?cephALEXin (KEFLEX) 250 MG capsule Take 1 capsule (250 mg total) by mouth 3 (three) times daily for 10 days. 03/27/22 04/06/22 Yes Elston Aldape, Almyra Free, PA-C  ?HYDROcodone-acetaminophen (NORCO/VICODIN) 5-325 MG tablet Take 1 tablet by mouth every 6 (six) hours as needed. 03/27/22  Yes Joice Nazario, Almyra Free, PA-C  ?metoprolol tartrate (LOPRESSOR) 25 MG tablet TAKE 1 TABLET BY MOUTH TWICE A DAY 12/17/21  Yes Satira Sark, MD  ?XARELTO 15 MG TABS tablet TAKE 1 TABLET (15 MG TOTAL) BY MOUTH DAILY. 03/23/22  Yes Satira Sark, MD  ?   ? ?Allergies    ?Patient has no known allergies.   ? ?Review of Systems   ?Review of Systems  ?Constitutional:  Negative for chills and fever.  ?Respiratory:  Negative for shortness of breath and wheezing.   ?Musculoskeletal:  Positive for arthralgias.  ?Skin:  Positive for wound.  ?Neurological:  Negative for numbness.  ?All other systems reviewed and are negative. ? ?Physical Exam ?Updated Vital Signs ?BP (!) 172/93   Pulse 94   Temp 98.3 ?F (36.8 ?C) (Oral)   Resp 18   Ht 5\' 7"  (1.702 m)   Wt 90.7 kg   SpO2 100%   BMI 31.32 kg/m?  ?Physical Exam ?Constitutional:   ?   Appearance: She is well-developed.  ?HENT:  ?   Head: Normocephalic.  ?Cardiovascular:  ?   Rate and Rhythm:  Normal rate.  ?Pulmonary:  ?   Effort: Pulmonary effort is normal.  ?Musculoskeletal:     ?   General: Tenderness present.  ?   Comments: Ttp left distal index finger, laceration present,  pt also has long acrylic nails which are intact.   ?Skin: ?   Findings: Laceration present.  ?   Comments: 1 linear lacerations , horizontal, left index finger tip 1 cm,  2nd laceration horizontal and more proximal to the first, curvilinear, 2 cm,  involving the organic nail plate which is just visible along the edge of the acrylic nail.       ?Neurological:  ?   Mental Status: She is alert and oriented to person, place, and time.  ?   Sensory: No sensory deficit.  ? ? ?ED Results / Procedures / Treatments   ?Labs ?(all labs ordered are listed, but only abnormal results are displayed) ?Labs Reviewed - No data to display ? ?EKG ?None ? ?Radiology ? ? ?DG Finger Index Left ? ?Result Date: 03/27/2022 ?CLINICAL DATA:  Cut end of finger with hedge clippers EXAM: LEFT INDEX FINGER 2+V COMPARISON:  None Available. FINDINGS: There is a mildly displaced fracture of the distal tuft of the index finger distal phalanx. There is an overlying soft tissue injury with surrounding swelling. There is  no other fracture. Joint alignment is normal. IMPRESSION: Mildly displaced fracture of the distal tuft of the index finger distal phalanx with overlying soft tissue injury and surrounding swelling. Electronically Signed   By: Valetta Mole M.D.   On: 03/27/2022 14:16   ? ? ?Procedures ?Procedures  ? ? ?LACERATION REPAIR ?Performed by: Evalee Jefferson ?Authorized by: Evalee Jefferson ?Consent: Verbal consent obtained. ?Risks and benefits: risks, benefits and alternatives were discussed ?Consent given by: patient ?Patient identity confirmed: provided demographic data ?Prepped and Draped in normal sterile fashion ?Wound explored ? ?Laceration Location: left index finger ? ?Laceration Length: 3 cm total ? ?No Foreign Bodies seen or palpated ? ?Anesthesia: digital  block ?Local anesthetic: lidocaine 2% without epinephrine ? ?Anesthetic total: 3 ml ? ?Irrigation method: syringe ?Amount of cleaning: NS after betadine scrub.  Finger tourniquet used for hemostasis and visualization of the wound during repair.   ?Skin closure: ethilon 4-0 ? ?Number of sutures: 5 on the proximal, 3 on the more distal wound, loose application. ? ?Technique: simple interrupted ? ?Patient tolerance: Patient tolerated the procedure well with no immediate complications. ? ? ?Medications Ordered in ED ?Medications  ?lidocaine HCl (PF) (XYLOCAINE) 2 % injection 5 mL (5 mLs Other Given by Other 03/27/22 1600)  ?povidone-iodine (BETADINE) 10 % external solution (  Given by Other 03/27/22 1600)  ?Tdap (BOOSTRIX) injection 0.5 mL (0.5 mLs Intramuscular Given 03/27/22 1729)  ? ? ?ED Course/ Medical Decision Making/ A&P ?  ?                        ?Medical Decision Making ?Pt with open fracture left distal index finger.  Wounds loosely closed. There were no tools present for safe removal of the acrylic nail.  There is a fingernail laceration, linear, radial side, it does not appear to be significantly impacting the nail bed.  Pt will need close ortho f/u given open nature fracture, referral given.  Bulky dressing applied, tetanus updated.  Abx, pain medicine also provided.   ? ?Return precautions outlined,  pt to call for OV with Dr. Aline Brochure.  ? ?Amount and/or Complexity of Data Reviewed ?Radiology: ordered. ? ?Risk ?Prescription drug management. ? ? ? ? ? ? ? ? ? ? ?Final Clinical Impression(s) / ED Diagnoses ?Final diagnoses:  ?Open displaced fracture of distal phalanx of left index finger, initial encounter  ? ? ?Rx / DC Orders ?ED Discharge Orders   ? ?      Ordered  ?  cephALEXin (KEFLEX) 250 MG capsule  3 times daily       ? 03/27/22 1653  ?  HYDROcodone-acetaminophen (NORCO/VICODIN) 5-325 MG tablet  Every 6 hours PRN       ? 03/27/22 1653  ? ?  ?  ? ?  ? ? ?  ?Evalee Jefferson, PA-C ?03/30/22 1253 ? ?   ?Fredia Sorrow, MD ?04/01/22 1657 ? ?

## 2022-04-02 ENCOUNTER — Encounter: Payer: Self-pay | Admitting: Orthopedic Surgery

## 2022-04-02 ENCOUNTER — Ambulatory Visit: Payer: BC Managed Care – PPO | Admitting: Orthopedic Surgery

## 2022-04-02 VITALS — BP 172/103 | HR 87 | Ht 67.0 in | Wt 200.0 lb

## 2022-04-02 DIAGNOSIS — S62661B Nondisplaced fracture of distal phalanx of left index finger, initial encounter for open fracture: Secondary | ICD-10-CM | POA: Diagnosis not present

## 2022-04-02 NOTE — Progress Notes (Signed)
Chief Complaint  Patient presents with   Hand Injury    Lt pointer finger DOI 03/27/22    HPI: 71 year old female was trimming some hedges thought she was perhaps bitten by a snake in injured her left index finger.  She is on Xarelto.  She had quite a time in the ER trying to get the bleeding to stop they placed several stitches and some aggravating gauze presents for evaluation and management status post open fracture distal phalanx left index finger  Past Medical History:  Diagnosis Date   Anxiety    CKD (chronic kidney disease) stage 3, GFR 30-59 ml/min (HCC)    Essential hypertension    Hyperlipidemia    PAF (paroxysmal atrial fibrillation) (HCC)    Type 2 diabetes mellitus (HCC)    Vitamin D deficiency     BP (!) 172/103   Pulse 87   Ht 5\' 7"  (1.702 m)   Wt 200 lb (90.7 kg)   BMI 31.32 kg/m    General appearance: Well-developed well-nourished no gross deformities  Cardiovascular normal pulse and perfusion normal color without edema  Neurologically no sensation loss or deficits or pathologic reflexes  Psychological: Awake alert and oriented x3 mood and affect normal  Skin no lacerations or ulcerations no nodularity no palpable masses, no erythema or nodularity  Musculoskeletal: I had to give her a digital block we could not get the stitches out.  The patient was anesthetized with 1% lidocaine plain 5 cc on each side she had good pain relief and then we removed the dressing and the stitches  We rewrapped the finger and she will come in Monday for a dressing change  Imaging  I will make an independent report on the outside imaging.  Outside imaging index finger left hand vertical fracture distal phalanx no displacement perhaps some mild angulation  A/P  Left index finger laceration with open distal phalanx fracture

## 2022-04-06 ENCOUNTER — Ambulatory Visit (INDEPENDENT_AMBULATORY_CARE_PROVIDER_SITE_OTHER): Payer: BC Managed Care – PPO | Admitting: Orthopedic Surgery

## 2022-04-06 DIAGNOSIS — S62661D Nondisplaced fracture of distal phalanx of left index finger, subsequent encounter for fracture with routine healing: Secondary | ICD-10-CM

## 2022-04-06 NOTE — Progress Notes (Signed)
FOLLOW UP   Encounter Diagnosis  Name Primary?   Open nondisplaced fracture of distal phalanx of left index finger with routine healing, subsequent encounter Yes     Chief Complaint  Patient presents with   Dressing Change    DOI 03/27/22 left index finger     The finger looks good now.  She can soak it with dish detergent teaspoon of salt warm water 10 to 15 minutes daily  Follow-up in a week

## 2022-04-06 NOTE — Patient Instructions (Signed)
Soak the finger in warm water, drop of Dawn dish detergent, teaspoon of salt.  Do it daily.  Do it for 10 to 15 minutes.  Cover with Band-Aids and put the stockinette back on the finger

## 2022-04-15 DIAGNOSIS — S62661B Nondisplaced fracture of distal phalanx of left index finger, initial encounter for open fracture: Secondary | ICD-10-CM | POA: Insufficient documentation

## 2022-04-16 ENCOUNTER — Ambulatory Visit (INDEPENDENT_AMBULATORY_CARE_PROVIDER_SITE_OTHER): Payer: BC Managed Care – PPO | Admitting: Orthopedic Surgery

## 2022-04-16 DIAGNOSIS — S62661D Nondisplaced fracture of distal phalanx of left index finger, subsequent encounter for fracture with routine healing: Secondary | ICD-10-CM

## 2022-04-16 NOTE — Patient Instructions (Signed)
CONTINUE SOAKING

## 2022-04-16 NOTE — Progress Notes (Signed)
FOLLOW UP   Encounter Diagnosis  Name Primary?   Open nondisplaced fracture of distal phalanx of left index finger with routine healing, subsequent encounter 03/27/22 Yes     Chief Complaint  Patient presents with   Hand Pain    LIF DOI 03/27/22    There are some stitches   The finger looks good   F/u 5 weeks  Continue soaking

## 2022-04-17 ENCOUNTER — Encounter: Payer: BC Managed Care – PPO | Admitting: Orthopedic Surgery

## 2022-04-22 DIAGNOSIS — N184 Chronic kidney disease, stage 4 (severe): Secondary | ICD-10-CM | POA: Diagnosis not present

## 2022-04-22 DIAGNOSIS — I10 Essential (primary) hypertension: Secondary | ICD-10-CM | POA: Diagnosis not present

## 2022-04-22 DIAGNOSIS — N2581 Secondary hyperparathyroidism of renal origin: Secondary | ICD-10-CM | POA: Diagnosis not present

## 2022-05-21 ENCOUNTER — Ambulatory Visit: Payer: BC Managed Care – PPO | Admitting: Orthopedic Surgery

## 2022-06-01 ENCOUNTER — Other Ambulatory Visit: Payer: Self-pay | Admitting: Cardiology

## 2022-06-25 ENCOUNTER — Encounter: Payer: Self-pay | Admitting: Orthopedic Surgery

## 2022-06-25 ENCOUNTER — Ambulatory Visit (INDEPENDENT_AMBULATORY_CARE_PROVIDER_SITE_OTHER): Payer: BC Managed Care – PPO | Admitting: Orthopedic Surgery

## 2022-06-25 DIAGNOSIS — S62661D Nondisplaced fracture of distal phalanx of left index finger, subsequent encounter for fracture with routine healing: Secondary | ICD-10-CM | POA: Diagnosis not present

## 2022-06-25 NOTE — Progress Notes (Signed)
FOLLOW UP   Encounter Diagnosis  Name Primary?   Open nondisplaced fracture of distal phalanx of left index finger with routine healing, subsequent encounter 03/27/22 Yes     Chief Complaint  Patient presents with   finger injury    LT index finger DOI 03/27/22    Puneet is doing well her finger has recovered nicely there is a little bit of a split at the end of the nail hopefully when the nail fully grows out that will go away and she can resume her artificial nail wear  She did lose a little bit of sensation at the fingertip the volar aspect of the wound healed nicely  I released her to return as needed

## 2022-07-07 DIAGNOSIS — Z111 Encounter for screening for respiratory tuberculosis: Secondary | ICD-10-CM | POA: Diagnosis not present

## 2022-07-09 DIAGNOSIS — Z6834 Body mass index (BMI) 34.0-34.9, adult: Secondary | ICD-10-CM | POA: Diagnosis not present

## 2022-07-09 DIAGNOSIS — Z681 Body mass index (BMI) 19 or less, adult: Secondary | ICD-10-CM | POA: Diagnosis not present

## 2022-07-09 DIAGNOSIS — Z021 Encounter for pre-employment examination: Secondary | ICD-10-CM | POA: Diagnosis not present

## 2022-07-09 DIAGNOSIS — Z111 Encounter for screening for respiratory tuberculosis: Secondary | ICD-10-CM | POA: Diagnosis not present

## 2022-09-08 DIAGNOSIS — N184 Chronic kidney disease, stage 4 (severe): Secondary | ICD-10-CM | POA: Diagnosis not present

## 2022-09-09 ENCOUNTER — Other Ambulatory Visit: Payer: Self-pay | Admitting: Cardiology

## 2022-09-09 NOTE — Telephone Encounter (Signed)
Prescription refill request for Xarelto received.  Indication: AF Last office visit: 08/25/21  Myles Gip MD (Has appt 12/06/22) Weight: 94.4kg Age: 71 Scr: 2.49 on 04/15/22 CrCl: 31.78  Based on above findings Xarelto 15mg  daily is the appropriate dose.  Refill approved.

## 2022-09-14 DIAGNOSIS — N184 Chronic kidney disease, stage 4 (severe): Secondary | ICD-10-CM | POA: Diagnosis not present

## 2022-09-14 DIAGNOSIS — N2581 Secondary hyperparathyroidism of renal origin: Secondary | ICD-10-CM | POA: Diagnosis not present

## 2022-09-14 DIAGNOSIS — I10 Essential (primary) hypertension: Secondary | ICD-10-CM | POA: Diagnosis not present

## 2022-11-21 DIAGNOSIS — Z0279 Encounter for issue of other medical certificate: Secondary | ICD-10-CM | POA: Diagnosis not present

## 2022-11-21 DIAGNOSIS — Z789 Other specified health status: Secondary | ICD-10-CM | POA: Diagnosis not present

## 2022-12-02 ENCOUNTER — Other Ambulatory Visit: Payer: Self-pay | Admitting: Cardiology

## 2022-12-16 ENCOUNTER — Encounter: Payer: Self-pay | Admitting: Cardiology

## 2022-12-16 ENCOUNTER — Ambulatory Visit: Payer: BC Managed Care – PPO | Attending: Cardiology | Admitting: Cardiology

## 2022-12-16 VITALS — BP 132/84 | HR 80 | Ht 67.0 in | Wt 202.0 lb

## 2022-12-16 DIAGNOSIS — I4819 Other persistent atrial fibrillation: Secondary | ICD-10-CM

## 2022-12-16 DIAGNOSIS — N184 Chronic kidney disease, stage 4 (severe): Secondary | ICD-10-CM

## 2022-12-16 NOTE — Progress Notes (Signed)
Cardiology Office Note  Date: 12/16/2022   ID: [name], [name] March 15, 1951, MRN [id number]  PCP:  Patient, No Pcp Per  Cardiologist:  Rozann Lesches, MD Electrophysiologist:  None   Chief Complaint  Patient presents with   Cardiac follow-up    History of Present Illness: Denise Rowe is a 72 y.o. female last seen in October 2022.  She is here for a follow-up visit.  Reports no sense of palpitations or chest pain with atrial fibrillation. ECG today shows rate controlled atrial fibrillation and she continues on Lopressor along with Xarelto as before.  No reported spontaneous bleeding problems.  I reviewed interval lab work from October 2023 as noted below.  She continues to follow with nephrology, sees Dr. Joelyn Oms in [place].  Most recent creatinine was 2.19.  She is currently in the process of looking for another job, has been doing substitute teaching for quite some time and wants to stay busy.  Past Medical History:  Diagnosis Date   Anxiety    CKD (chronic kidney disease) stage 3, GFR 30-59 ml/min (HCC)    Essential hypertension    Hyperlipidemia    PAF (paroxysmal atrial fibrillation) (HCC)    Type 2 diabetes mellitus (HCC)    Vitamin D deficiency     Current Outpatient Medications  Medication Sig Dispense Refill   metoprolol tartrate (LOPRESSOR) 25 MG tablet TAKE 1 TABLET BY MOUTH TWICE A DAY 180 tablet 1   XARELTO 15 MG TABS tablet TAKE 1 TABLET (15 MG TOTAL) BY MOUTH DAILY. 90 tablet 1   No current facility-administered medications for this visit.   Allergies:  Patient has no known allergies.   ROS: No syncope.  Physical Exam: VS:  BP 132/84   Pulse 80   Ht 5\' 7"  (1.702 m)   Wt 202 lb (91.6 kg)   BMI 31.64 kg/m , BMI Body mass index is 31.64 kg/m.  Wt Readings from Last 3 Encounters:  12/16/22 202 lb (91.6 kg)  04/02/22 200 lb (90.7 kg)  03/27/22 200 lb (90.7 kg)    General: Patient appears comfortable at rest. HEENT: Conjunctiva  and lids normal. Neck: Supple, no elevated JVP or carotid bruits. Lungs: Clear to auscultation, nonlabored breathing at rest. Cardiac: Irregularly irregular, no S3 or significant systolic murmur. Extremities: No pitting edema.  ECG:  An ECG dated 12/25/2020 was personally reviewed today and demonstrated:  Rate controlled atrial fibrillation with rightward axis.  Recent Labwork:  October 2023: BUN 23, creatinine 2.19, potassium 4.3, hemoglobin 13.9  Other Studies Reviewed Today:  No interval cardiac testing for review today.  Assessment and Plan:  1.  Permanent atrial fibrillation with CHA2DS2-VASc score of 4.  Heart rate adequately controlled on Lopressor and she remains asymptomatic in terms of palpitations.  Continue renally dosed Xarelto for stroke prophylaxis.  I reviewed her recent lab work.  2.  CKD stage IV, most recent creatinine 2.19.  3.  Essential hypertension.  Previously on Norvasc, now Lopressor alone..  Continue to track blood pressure intermittently.  Medication Adjustments/Labs and Tests Ordered: Current medicines are reviewed at length with the patient today.  Concerns regarding medicines are outlined above.   Tests Ordered: Orders Placed This Encounter  Procedures   EKG 12-Lead    Medication Changes: No orders of the defined types were placed in this encounter.   Disposition:  Follow up  6 months.  Signed, Satira Sark, MD, [facility] 12/16/2022 3:54 PM    [facility] [place]  at [place]. [address], [place], [place] [address] Phone: [phone]; Fax: [phone]

## 2022-12-16 NOTE — Patient Instructions (Signed)
Medication Instructions:  Your physician recommends that you continue on your current medications as directed. Please refer to the Current Medication list given to you today.   Labwork: None  Testing/Procedures: None  Follow-Up: Follow up with Dr. McDowell in 6 months.   Any Other Special Instructions Will Be Listed Below (If Applicable).     If you need a refill on your cardiac medications before your next appointment, please call your pharmacy.  

## 2023-01-12 DIAGNOSIS — N184 Chronic kidney disease, stage 4 (severe): Secondary | ICD-10-CM | POA: Diagnosis not present

## 2023-01-14 ENCOUNTER — Encounter: Payer: Self-pay | Admitting: Radiology

## 2023-01-18 DIAGNOSIS — N2581 Secondary hyperparathyroidism of renal origin: Secondary | ICD-10-CM | POA: Diagnosis not present

## 2023-01-18 DIAGNOSIS — R809 Proteinuria, unspecified: Secondary | ICD-10-CM | POA: Diagnosis not present

## 2023-01-18 DIAGNOSIS — I10 Essential (primary) hypertension: Secondary | ICD-10-CM | POA: Diagnosis not present

## 2023-01-18 DIAGNOSIS — N184 Chronic kidney disease, stage 4 (severe): Secondary | ICD-10-CM | POA: Diagnosis not present

## 2023-01-19 ENCOUNTER — Telehealth: Payer: Self-pay | Admitting: Cardiology

## 2023-01-19 NOTE — Telephone Encounter (Signed)
Patient stated she received a visit bill and needs to clarify if Dr. Domenic Polite is still in-network for [facility].

## 2023-02-28 IMAGING — DX DG FINGER INDEX 2+V*L*
3 series · 3 of 3 positions shown · non-contrast
Comparison: None Available.

CLINICAL DATA: Cut end of finger with Pascuale clippers

EXAM:
LEFT INDEX FINGER 2+V

[finger ap]
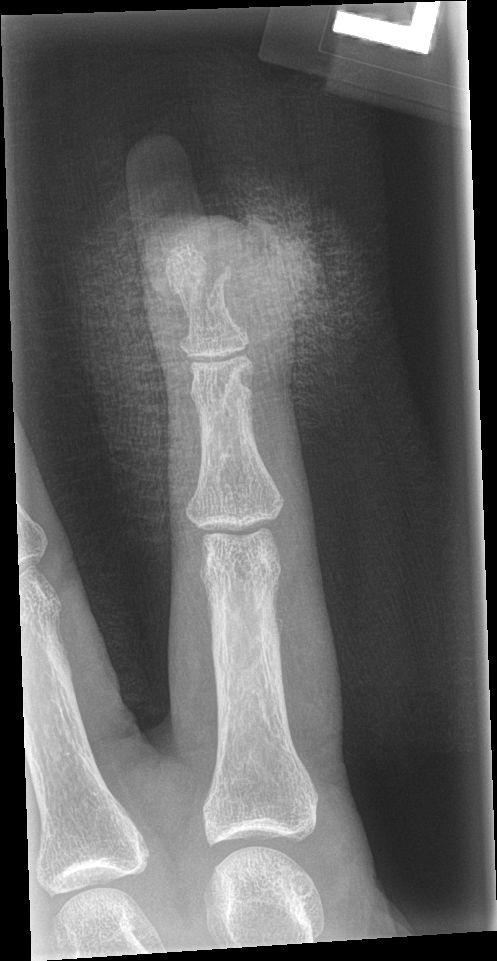

[finger obl]
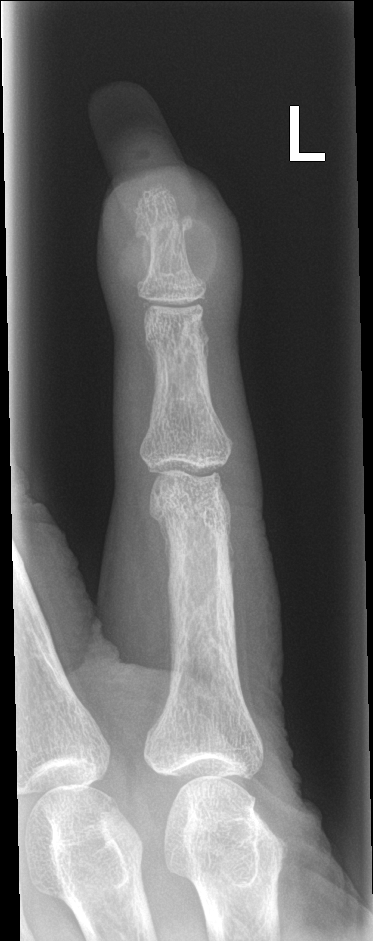

[finger lat]
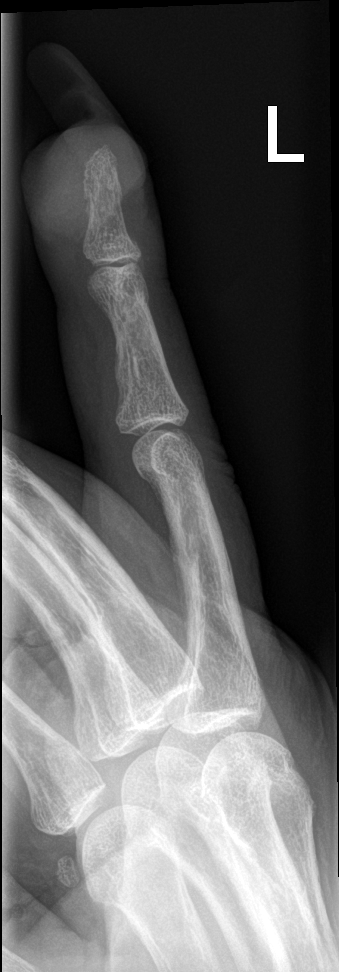

[3 of 3 positions shown; findings below may reference images not displayed]

FINDINGS: There is a mildly displaced fracture of the distal tuft of the index
finger distal phalanx. There is an overlying soft tissue injury with
surrounding swelling. There is no other fracture. Joint alignment is
normal.
IMPRESSION: Mildly displaced fracture of the distal tuft of the index finger
distal phalanx with overlying soft tissue injury and surrounding
swelling.

## 2023-03-10 ENCOUNTER — Other Ambulatory Visit: Payer: Self-pay | Admitting: Cardiology

## 2023-03-10 NOTE — Telephone Encounter (Signed)
Pt last saw Dr Diona Browner 12/16/22, last labs 01/12/23 Creat 2.41 at [facility] per [facility], age 72, weight 91.6kg, CrCl 31.41, based on CrCl pt is on appropriate dosage of Xarelto  QD for afib.  Will refill rx.

## 2023-03-12 DIAGNOSIS — N390 Urinary tract infection, site not specified: Secondary | ICD-10-CM | POA: Diagnosis not present

## 2023-03-24 DIAGNOSIS — R109 Unspecified abdominal pain: Secondary | ICD-10-CM | POA: Diagnosis not present

## 2023-03-24 DIAGNOSIS — I10 Essential (primary) hypertension: Secondary | ICD-10-CM | POA: Diagnosis not present

## 2023-06-28 DIAGNOSIS — N184 Chronic kidney disease, stage 4 (severe): Secondary | ICD-10-CM | POA: Diagnosis not present

## 2023-07-02 DIAGNOSIS — N184 Chronic kidney disease, stage 4 (severe): Secondary | ICD-10-CM | POA: Diagnosis not present

## 2023-07-02 DIAGNOSIS — I129 Hypertensive chronic kidney disease with stage 1 through stage 4 chronic kidney disease, or unspecified chronic kidney disease: Secondary | ICD-10-CM | POA: Diagnosis not present

## 2023-07-02 DIAGNOSIS — N2581 Secondary hyperparathyroidism of renal origin: Secondary | ICD-10-CM | POA: Diagnosis not present

## 2023-07-02 DIAGNOSIS — R809 Proteinuria, unspecified: Secondary | ICD-10-CM | POA: Diagnosis not present

## 2023-07-30 ENCOUNTER — Other Ambulatory Visit: Payer: Self-pay | Admitting: Cardiology

## 2023-07-30 ENCOUNTER — Telehealth: Payer: Self-pay | Admitting: Cardiology

## 2023-07-30 DIAGNOSIS — I482 Chronic atrial fibrillation, unspecified: Secondary | ICD-10-CM

## 2023-07-30 NOTE — Telephone Encounter (Signed)
Xarelto 15mg  refill request received. Pt is 72 years old, weight-91.6kg, Crea-2.19 on 09/08/22 via [facility] from [employer], last seen by Dr. Diona Browner on 12/16/22, Diagnosis-Afib, CrCl-34.56 mL/min; Dose is appropriate based on dosing criteria. Will send in a refill to requested pharmacy.

## 2023-07-30 NOTE — Telephone Encounter (Signed)
Xarelto 15mg  refill addressed/sent today at 1428pm with confirmation. Will not resend.

## 2023-07-30 NOTE — Telephone Encounter (Signed)
*  STAT* If patient is at the pharmacy, call can be transferred to refill team.   1. Which medications need to be refilled? (please list name of each medication and dose if known) XARELTO 15 MG TABS tablet     4. Which pharmacy/location (including street and city if local pharmacy) is medication to be sent to? [id number] - [place], [place] - [address]     5. Do they need a 30 day or 90 day supply? 90   Pt states she is completely out

## 2023-10-22 DIAGNOSIS — N184 Chronic kidney disease, stage 4 (severe): Secondary | ICD-10-CM | POA: Diagnosis not present

## 2023-10-28 DIAGNOSIS — N2581 Secondary hyperparathyroidism of renal origin: Secondary | ICD-10-CM | POA: Diagnosis not present

## 2023-10-28 DIAGNOSIS — N184 Chronic kidney disease, stage 4 (severe): Secondary | ICD-10-CM | POA: Diagnosis not present

## 2023-10-28 DIAGNOSIS — R809 Proteinuria, unspecified: Secondary | ICD-10-CM | POA: Diagnosis not present

## 2023-10-28 DIAGNOSIS — I129 Hypertensive chronic kidney disease with stage 1 through stage 4 chronic kidney disease, or unspecified chronic kidney disease: Secondary | ICD-10-CM | POA: Diagnosis not present

## 2023-11-15 ENCOUNTER — Encounter (HOSPITAL_COMMUNITY): Payer: Self-pay | Admitting: *Deleted

## 2023-11-15 ENCOUNTER — Other Ambulatory Visit: Payer: Self-pay

## 2023-11-15 ENCOUNTER — Emergency Department (HOSPITAL_COMMUNITY): Payer: Medicare Other

## 2023-11-15 ENCOUNTER — Emergency Department (HOSPITAL_COMMUNITY)
Admission: EM | Admit: 2023-11-15 | Discharge: 2023-11-15 | Disposition: A | Discharge status: Home / Self Care | Payer: Medicare Other | Attending: Emergency Medicine | Admitting: Emergency Medicine

## 2023-11-15 DIAGNOSIS — Z7901 Long term (current) use of anticoagulants: Secondary | ICD-10-CM | POA: Diagnosis not present

## 2023-11-15 DIAGNOSIS — Z79899 Other long term (current) drug therapy: Secondary | ICD-10-CM | POA: Diagnosis not present

## 2023-11-15 DIAGNOSIS — N189 Chronic kidney disease, unspecified: Secondary | ICD-10-CM | POA: Diagnosis not present

## 2023-11-15 DIAGNOSIS — I129 Hypertensive chronic kidney disease with stage 1 through stage 4 chronic kidney disease, or unspecified chronic kidney disease: Secondary | ICD-10-CM | POA: Diagnosis not present

## 2023-11-15 DIAGNOSIS — M79672 Pain in left foot: Secondary | ICD-10-CM | POA: Diagnosis not present

## 2023-11-15 DIAGNOSIS — M7732 Calcaneal spur, left foot: Secondary | ICD-10-CM | POA: Diagnosis not present

## 2023-11-15 MED ORDER — DEXAMETHASONE SODIUM PHOSPHATE 10 MG/ML IJ SOLN
10.0000 mg | Freq: Once | INTRAMUSCULAR | Status: AC
  Administered 2023-11-15: 10 mg via INTRAMUSCULAR
  Filled 2023-11-15: qty 1

## 2023-11-15 NOTE — ED Triage Notes (Signed)
Pt with left foot pain x 4 days, fell 2 weeks ago. Pt admits to doing a lot of walking before christmas and was beginning to hurt then but now not able to put weight.

## 2023-11-15 NOTE — ED Notes (Addendum)
Cam boot applied to right foot

## 2023-11-15 NOTE — Discharge Instructions (Addendum)
As we discussed, your workup in the ER today was reassuring for acute findings.  X-ray imaging of your foot did not reveal any emergent cause of your symptoms.  We have given you a shot of steroids for inflammation which should improve your pain and swelling.  We have also given you a walking boot to wear to help take stress off of the area that hurts when you are walking.  Please wear this when you are walking only.  Please take it off to sleep at night and shower.  I also recommend that you get Voltaren gel which you can get over-the-counter at your local pharmacy or grocery store for additional symptomatic relief.  I recommend that you rest, ice, compress, and elevate your foot.  I have also given you a referral to orthopedics with a number to call to schedule an appointment for follow-up.  Please call at your earliest convenience.  Return if development of any new or worsening symptoms.

## 2023-12-08 ENCOUNTER — Ambulatory Visit: Payer: BC Managed Care – PPO | Admitting: Cardiology

## 2024-01-05 ENCOUNTER — Other Ambulatory Visit: Payer: Self-pay | Admitting: Cardiology

## 2024-02-22 ENCOUNTER — Other Ambulatory Visit: Payer: Self-pay | Admitting: Cardiology

## 2024-02-22 DIAGNOSIS — I482 Chronic atrial fibrillation, unspecified: Secondary | ICD-10-CM

## 2024-02-23 NOTE — Telephone Encounter (Signed)
 Prescription refill request for Xarelto received.  Indication: AF Last office visit: 12/16/22  Ival Bible MD  (appt 03/01/24) Weight: 91.6kg Age: 73 Scr: 2.18 on 04/28/23  [facility] CrCl: 34.23  Based on above findings Xarelto 15mg  daily is the appropriate dose.  Refill approved.

## 2024-03-01 ENCOUNTER — Encounter: Payer: Self-pay | Admitting: Cardiology

## 2024-03-01 ENCOUNTER — Ambulatory Visit: Payer: BC Managed Care – PPO | Attending: Cardiology | Admitting: Cardiology

## 2024-03-01 VITALS — BP 130/82 | HR 56 | Ht 65.0 in | Wt 217.4 lb

## 2024-03-01 DIAGNOSIS — I480 Paroxysmal atrial fibrillation: Secondary | ICD-10-CM

## 2024-03-01 DIAGNOSIS — I4819 Other persistent atrial fibrillation: Secondary | ICD-10-CM

## 2024-03-01 DIAGNOSIS — I4821 Permanent atrial fibrillation: Secondary | ICD-10-CM

## 2024-03-01 DIAGNOSIS — N184 Chronic kidney disease, stage 4 (severe): Secondary | ICD-10-CM

## 2024-03-01 NOTE — Progress Notes (Signed)
    Cardiology Office Note  Date: 03/01/2024   ID: Umaiza Matusik [place],  1951/08/29, MRN [id number]  History of Present Illness: Marycarmen Hagey is a 73 y.o. female last seen in January 2024.  She is here for a follow-up visit.  Reports no palpitations or exertional chest pain, no unusual dyspnea with typical activities.  She continues to work as a Lawyer, [employer] in [place].  We went over her medications.  She reports compliance with therapy, no spontaneous bleeding problems on Eliquis.  She continues to follow with nephrology as well, I reviewed her most recent lab work.  I reviewed her ECG which shows rate controlled atrial fibrillation with low voltage.  Physical Exam: VS:  BP 130/82 (BP Location: Left Arm, Cuff Size: Large)   Pulse (!) 56   Ht 5\' 5"  (1.651 m)   Wt 217 lb 6.4 oz (98.6 kg)   SpO2 100%   BMI 36.18 kg/m , BMI Body mass index is 36.18 kg/m.  Wt Readings from Last 3 Encounters:  03/01/24 217 lb 6.4 oz (98.6 kg)  11/15/23 204 lb (92.5 kg)  12/16/22 202 lb (91.6 kg)    General: Patient appears comfortable at rest. HEENT: Conjunctiva and lids normal. Neck: Supple, no elevated JVP or carotid bruits. Lungs: Clear to auscultation, nonlabored breathing at rest. Cardiac: Irregularly irregular, no significant murmur or gallop.  ECG:  An ECG dated 12/16/2022 was personally reviewed today and demonstrated:  Rate controlled atrial fibrillation.  Labwork:  December 2024: BUN 22, creatinine 2.18, GFR 24, potassium 4.9, hemoglobin 14.4  Other Studies Reviewed Today:  No interval cardiac testing for review today.  Assessment and Plan:  1.  Permanent atrial fibrillation with CHA2DS2-VASc score of 4.  She is asymptomatic in terms of palpitations and heart rate is well-controlled on Lopressor 25 mg twice daily.  Continue Xarelto 15 mg daily for stroke prophylaxis.  Interval lab work reviewed.   2.  CKD stage IV, creatinine 2.18 with GFR 24  in December 2024.  She continues to follow with nephrology.   3.  Primary hypertension.  No changes made to current regimen.  Disposition:  Follow up  1 year.  Signed, Gerard Knight, M.D., F.A.C.C. [facility] at [facility]

## 2024-03-01 NOTE — Patient Instructions (Signed)
Medication Instructions:  Your physician recommends that you continue on your current medications as directed. Please refer to the Current Medication list given to you today.   Labwork: None today  Testing/Procedures: None today  Follow-Up: 1 year Dr.McDowell  Any Other Special Instructions Will Be Listed Below (If Applicable).  If you need a refill on your cardiac medications before your next appointment, please call your pharmacy.

## 2024-03-15 DIAGNOSIS — I129 Hypertensive chronic kidney disease with stage 1 through stage 4 chronic kidney disease, or unspecified chronic kidney disease: Secondary | ICD-10-CM | POA: Diagnosis not present

## 2024-03-15 DIAGNOSIS — R809 Proteinuria, unspecified: Secondary | ICD-10-CM | POA: Diagnosis not present

## 2024-03-15 DIAGNOSIS — N2581 Secondary hyperparathyroidism of renal origin: Secondary | ICD-10-CM | POA: Diagnosis not present

## 2024-03-15 DIAGNOSIS — N184 Chronic kidney disease, stage 4 (severe): Secondary | ICD-10-CM | POA: Diagnosis not present

## 2024-06-28 ENCOUNTER — Other Ambulatory Visit: Payer: Self-pay | Admitting: Cardiology

## 2024-07-13 DIAGNOSIS — N184 Chronic kidney disease, stage 4 (severe): Secondary | ICD-10-CM | POA: Diagnosis not present

## 2024-07-19 DIAGNOSIS — I10 Essential (primary) hypertension: Secondary | ICD-10-CM | POA: Diagnosis not present

## 2024-07-19 DIAGNOSIS — I129 Hypertensive chronic kidney disease with stage 1 through stage 4 chronic kidney disease, or unspecified chronic kidney disease: Secondary | ICD-10-CM | POA: Diagnosis not present

## 2024-07-19 DIAGNOSIS — R809 Proteinuria, unspecified: Secondary | ICD-10-CM | POA: Diagnosis not present

## 2024-07-19 DIAGNOSIS — N2581 Secondary hyperparathyroidism of renal origin: Secondary | ICD-10-CM | POA: Diagnosis not present

## 2024-07-19 DIAGNOSIS — N184 Chronic kidney disease, stage 4 (severe): Secondary | ICD-10-CM | POA: Diagnosis not present

## 2024-08-16 ENCOUNTER — Other Ambulatory Visit: Payer: Self-pay

## 2024-08-16 ENCOUNTER — Emergency Department (HOSPITAL_COMMUNITY)

## 2024-08-16 ENCOUNTER — Encounter (HOSPITAL_COMMUNITY): Payer: Self-pay

## 2024-08-16 ENCOUNTER — Emergency Department (HOSPITAL_COMMUNITY)
Admission: EM | Admit: 2024-08-16 | Discharge: 2024-08-16 | Disposition: A | Discharge status: Home / Self Care | Attending: Emergency Medicine | Admitting: Emergency Medicine

## 2024-08-16 DIAGNOSIS — W1830XA Fall on same level, unspecified, initial encounter: Secondary | ICD-10-CM | POA: Diagnosis not present

## 2024-08-16 DIAGNOSIS — M79602 Pain in left arm: Secondary | ICD-10-CM | POA: Diagnosis not present

## 2024-08-16 DIAGNOSIS — Z79899 Other long term (current) drug therapy: Secondary | ICD-10-CM | POA: Diagnosis not present

## 2024-08-16 DIAGNOSIS — N189 Chronic kidney disease, unspecified: Secondary | ICD-10-CM | POA: Diagnosis not present

## 2024-08-16 DIAGNOSIS — M25512 Pain in left shoulder: Secondary | ICD-10-CM | POA: Diagnosis not present

## 2024-08-16 DIAGNOSIS — S46912A Strain of unspecified muscle, fascia and tendon at shoulder and upper arm level, left arm, initial encounter: Secondary | ICD-10-CM | POA: Diagnosis not present

## 2024-08-16 DIAGNOSIS — E1122 Type 2 diabetes mellitus with diabetic chronic kidney disease: Secondary | ICD-10-CM | POA: Diagnosis not present

## 2024-08-16 DIAGNOSIS — S40022A Contusion of left upper arm, initial encounter: Secondary | ICD-10-CM | POA: Insufficient documentation

## 2024-08-16 DIAGNOSIS — S40012A Contusion of left shoulder, initial encounter: Secondary | ICD-10-CM | POA: Diagnosis not present

## 2024-08-16 DIAGNOSIS — M85822 Other specified disorders of bone density and structure, left upper arm: Secondary | ICD-10-CM | POA: Diagnosis not present

## 2024-08-16 DIAGNOSIS — S4992XA Unspecified injury of left shoulder and upper arm, initial encounter: Secondary | ICD-10-CM | POA: Diagnosis not present

## 2024-08-16 DIAGNOSIS — Z7901 Long term (current) use of anticoagulants: Secondary | ICD-10-CM | POA: Diagnosis not present

## 2024-08-16 DIAGNOSIS — I129 Hypertensive chronic kidney disease with stage 1 through stage 4 chronic kidney disease, or unspecified chronic kidney disease: Secondary | ICD-10-CM | POA: Diagnosis not present

## 2024-08-16 DIAGNOSIS — Y92009 Unspecified place in unspecified non-institutional (private) residence as the place of occurrence of the external cause: Secondary | ICD-10-CM | POA: Diagnosis not present

## 2024-08-16 DIAGNOSIS — I4891 Unspecified atrial fibrillation: Secondary | ICD-10-CM | POA: Diagnosis not present

## 2024-08-16 NOTE — ED Triage Notes (Signed)
 Pt arrived via POV from home c/o left upper arm pain from a fall last night at home. Pt denies LOC or hitting her head. Pt presents with limited mobility in her LUE, and is wearing a sling that belongs to her husband.

## 2024-08-16 NOTE — ED Provider Notes (Signed)
 [place] EMERGENCY DEPARTMENT AT [facility] Provider Note   CSN: [id number] Arrival date & time: 08/16/24  1404     Patient presents with: Denise Rowe is a 73 y.o. female.   Pt is a 73 yo female with pmhx significant for htn, dm2, hld, afib (on Xarelto ), and CKD.  Pt said she fell yesterday and hit her left upper arm.  She did not hit her head.  No other injury.  She has decreased mvmt in her arm.        Prior to Admission medications   Medication Sig Start Date End Date Taking? Authorizing Provider  metoprolol  tartrate (LOPRESSOR ) 25 MG tablet TAKE 1 TABLET BY MOUTH TWICE A DAY 06/28/24  Yes Debera Jayson MATSU, MD  XARELTO  15 MG TABS tablet TAKE 1 TABLET (15 MG TOTAL) BY MOUTH DAILY. 02/23/24  Yes Debera Jayson MATSU, MD    Allergies: Patient has no known allergies.    Review of Systems  Musculoskeletal:        Left upper arm pain  All other systems reviewed and are negative.   Updated Vital Signs BP (!) 167/111   Pulse (!) 116   Temp 97.9 F (36.6 C)   Resp 17   Ht 5' 7 (1.702 m)   Wt 94.3 kg   SpO2 99%   BMI 32.58 kg/m   Physical Exam Vitals and nursing note reviewed.  Constitutional:      Appearance: Normal appearance. She is obese.  HENT:     Head: Normocephalic and atraumatic.     Right Ear: External ear normal.     Left Ear: External ear normal.     Nose: Nose normal.     Mouth/Throat:     Mouth: Mucous membranes are moist.     Pharynx: Oropharynx is clear.  Eyes:     Extraocular Movements: Extraocular movements intact.     Conjunctiva/sclera: Conjunctivae normal.     Pupils: Pupils are equal, round, and reactive to light.  Cardiovascular:     Rate and Rhythm: Normal rate and regular rhythm.     Pulses: Normal pulses.     Heart sounds: Normal heart sounds.  Pulmonary:     Effort: Pulmonary effort is normal.     Breath sounds: Normal breath sounds.  Abdominal:     General: Abdomen is flat. Bowel sounds are normal.      Palpations: Abdomen is soft.  Musculoskeletal:     Cervical back: Normal range of motion and neck supple.     Comments: Bruising left upper arm.  Good passive mvmt to shoulder, but pt's not able to move it herself.  Skin:    General: Skin is warm.     Capillary Refill: Capillary refill takes less than 2 seconds.  Neurological:     General: No focal deficit present.     Mental Status: She is alert and oriented to person, place, and time.  Psychiatric:        Mood and Affect: Mood normal.        Behavior: Behavior normal.     (all labs ordered are listed, but only abnormal results are displayed) Labs Reviewed - No data to display  EKG: None  Radiology: DG Shoulder Left Result Date: 08/16/2024 CLINICAL DATA:  Left shoulder pain after fall last night. EXAM: LEFT SHOULDER - 2+ VIEW COMPARISON:  None Available. FINDINGS: There is no evidence of fracture or dislocation. There is no evidence of arthropathy or  other focal bone abnormality. Soft tissues are unremarkable. IMPRESSION: Negative. Electronically Signed   By: Lynwood Landy Raddle M.D.   On: 08/16/2024 15:43   DG Humerus Left Result Date: 08/16/2024 CLINICAL DATA:  Fall.  Arm pain. EXAM: LEFT HUMERUS - 2+ VIEW COMPARISON:  None Available. FINDINGS: Osteopenia. No acute osseous abnormality. A pin is seen in the radial head. IMPRESSION: No acute findings. Electronically Signed   By: Newell Eke M.D.   On: 08/16/2024 15:12     Procedures   Medications Ordered in the ED - No data to display                                  Medical Decision Making Amount and/or Complexity of Data Reviewed Radiology: ordered.   This patient presents to the ED for concern of arm pain, this involves an extensive number of treatment options, and is a complaint that carries with it a high risk of complications and morbidity.  The differential diagnosis includes fx, sprain, contusion   Co morbidities that complicate the patient evaluation  htn,  dm2, hld, afib (on Xarelto ), and CKD.   Additional history obtained:  Additional history obtained from epic chart review External records from outside source obtained and reviewed including husband  Imaging Studies ordered:  I ordered imaging studies including left humerus/shoulder  I independently visualized and interpreted imaging which showed  L humerus: No acute findings.  L shoulder: Negative.  I agree with the radiologist interpretation  Medicines ordered and prescription drug management:  I have reviewed the patients home medicines and have made adjustments as needed   Problem List / ED Course:  Left upper arm contusion/shoulder strain:  pt placed in a sling.  She is to f/u with ortho.  She did not want anything for pain.  She is to return if worse.   Reevaluation:  After the interventions noted above, I reevaluated the patient and found that they have :improved   Social Determinants of Health:  Lives at home   Dispostion:  After consideration of the diagnostic results and the patients response to treatment, I feel that the patent would benefit from discharge with outpatient f/u.       Final diagnoses:  Strain of left shoulder, initial encounter  Arm contusion, left, initial encounter    ED Discharge Orders     None          Dean Clarity, MD 08/16/24 2345

## 2024-08-16 NOTE — ED Notes (Signed)
 Pt is in AFIB in triage, has hx.

## 2024-08-16 NOTE — ED Notes (Signed)
 Patient transported to X-ray

## 2024-08-23 ENCOUNTER — Other Ambulatory Visit: Payer: Self-pay | Admitting: Cardiology

## 2024-08-23 DIAGNOSIS — I482 Chronic atrial fibrillation, unspecified: Secondary | ICD-10-CM

## 2024-08-23 NOTE — Telephone Encounter (Signed)
 Prescription refill request for Xarelto  received.  Indication: AF Last office visit: 03/01/24  GORMAN Sierras MD Weight: 98.6kg Age: 73 Scr: 2.65 on 12/26/23  Epic CrCl: 30.31  Based on above findings Xarelto  15mg  daily is the appropriate dose.  Refill approved.

## 2024-11-15 ENCOUNTER — Other Ambulatory Visit: Payer: Self-pay | Admitting: Cardiology

## 2024-12-22 ENCOUNTER — Encounter: Payer: Self-pay | Admitting: *Deleted

## 2024-12-22 NOTE — Progress Notes (Signed)
 Denise Rowe                                          MRN: [id number]   12/22/2024   The [facility] Quality Team Specialist reviewed this patient medical record for the purposes of chart review for care gap closure. The following were reviewed: chart review for care gap closure-glycemic status assessment.    [facility] Quality Team
